# Patient Record
Sex: Female | Born: 1945
Health system: Southern US, Community
[De-identification: ages and names within clinical notes are randomized; demographics above are authoritative.]

## PROBLEM LIST (undated history)

## (undated) DIAGNOSIS — N200 Calculus of kidney: Secondary | ICD-10-CM

## (undated) DIAGNOSIS — I10 Essential (primary) hypertension: Secondary | ICD-10-CM

## (undated) DIAGNOSIS — C801 Malignant (primary) neoplasm, unspecified: Secondary | ICD-10-CM

## (undated) DIAGNOSIS — I82409 Acute embolism and thrombosis of unspecified deep veins of unspecified lower extremity: Secondary | ICD-10-CM

## (undated) DIAGNOSIS — D682 Hereditary deficiency of other clotting factors: Secondary | ICD-10-CM

## (undated) DIAGNOSIS — J449 Chronic obstructive pulmonary disease, unspecified: Secondary | ICD-10-CM

## (undated) DIAGNOSIS — K222 Esophageal obstruction: Secondary | ICD-10-CM

## (undated) DIAGNOSIS — E785 Hyperlipidemia, unspecified: Secondary | ICD-10-CM

## (undated) DIAGNOSIS — A809 Acute poliomyelitis, unspecified: Secondary | ICD-10-CM

## (undated) HISTORY — DX: Acute embolism and thrombosis of unspecified deep veins of unspecified lower extremity: I82.409

## (undated) HISTORY — DX: Esophageal obstruction: K22.2

## (undated) HISTORY — DX: Hyperlipidemia, unspecified: E78.5

## (undated) HISTORY — PX: OTHER SURGICAL HISTORY: SHX169

## (undated) HISTORY — DX: Essential (primary) hypertension: I10

## (undated) HISTORY — DX: Chronic obstructive pulmonary disease, unspecified: J44.9

## (undated) HISTORY — DX: Acute poliomyelitis, unspecified: A80.9

## (undated) HISTORY — DX: Calculus of kidney: N20.0

## (undated) HISTORY — DX: Malignant (primary) neoplasm, unspecified: C80.1

## (undated) HISTORY — DX: Hereditary deficiency of other clotting factors: D68.2

---
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
## 2015-08-26 DIAGNOSIS — J449 Chronic obstructive pulmonary disease, unspecified: Secondary | ICD-10-CM | POA: Diagnosis not present

## 2015-08-26 DIAGNOSIS — E785 Hyperlipidemia, unspecified: Secondary | ICD-10-CM | POA: Diagnosis not present

## 2015-08-26 DIAGNOSIS — I10 Essential (primary) hypertension: Secondary | ICD-10-CM | POA: Diagnosis not present

## 2015-08-26 DIAGNOSIS — I2699 Other pulmonary embolism without acute cor pulmonale: Secondary | ICD-10-CM | POA: Diagnosis not present

## 2015-08-26 DIAGNOSIS — R5383 Other fatigue: Secondary | ICD-10-CM | POA: Diagnosis not present

## 2015-08-26 DIAGNOSIS — E039 Hypothyroidism, unspecified: Secondary | ICD-10-CM | POA: Diagnosis not present

## 2015-08-26 DIAGNOSIS — I82402 Acute embolism and thrombosis of unspecified deep veins of left lower extremity: Secondary | ICD-10-CM | POA: Diagnosis not present

## 2015-08-26 DIAGNOSIS — R5381 Other malaise: Secondary | ICD-10-CM | POA: Diagnosis not present

## 2015-08-26 DIAGNOSIS — M069 Rheumatoid arthritis, unspecified: Secondary | ICD-10-CM | POA: Diagnosis not present

## 2015-08-26 DIAGNOSIS — M109 Gout, unspecified: Secondary | ICD-10-CM | POA: Diagnosis not present

## 2015-09-23 DIAGNOSIS — I2699 Other pulmonary embolism without acute cor pulmonale: Secondary | ICD-10-CM | POA: Diagnosis not present

## 2015-09-23 DIAGNOSIS — I82402 Acute embolism and thrombosis of unspecified deep veins of left lower extremity: Secondary | ICD-10-CM | POA: Diagnosis not present

## 2015-09-23 DIAGNOSIS — I10 Essential (primary) hypertension: Secondary | ICD-10-CM | POA: Diagnosis not present

## 2015-09-23 DIAGNOSIS — E785 Hyperlipidemia, unspecified: Secondary | ICD-10-CM | POA: Diagnosis not present

## 2015-10-07 DIAGNOSIS — R5381 Other malaise: Secondary | ICD-10-CM | POA: Diagnosis not present

## 2015-10-07 DIAGNOSIS — Z205 Contact with and (suspected) exposure to viral hepatitis: Secondary | ICD-10-CM | POA: Diagnosis not present

## 2015-10-07 DIAGNOSIS — I82402 Acute embolism and thrombosis of unspecified deep veins of left lower extremity: Secondary | ICD-10-CM | POA: Diagnosis not present

## 2015-10-07 DIAGNOSIS — E785 Hyperlipidemia, unspecified: Secondary | ICD-10-CM | POA: Diagnosis not present

## 2015-10-07 DIAGNOSIS — R5383 Other fatigue: Secondary | ICD-10-CM | POA: Diagnosis not present

## 2015-10-07 DIAGNOSIS — I10 Essential (primary) hypertension: Secondary | ICD-10-CM | POA: Diagnosis not present

## 2015-10-07 DIAGNOSIS — K209 Esophagitis, unspecified: Secondary | ICD-10-CM | POA: Diagnosis not present

## 2015-10-07 DIAGNOSIS — I2699 Other pulmonary embolism without acute cor pulmonale: Secondary | ICD-10-CM | POA: Diagnosis not present

## 2015-10-20 DIAGNOSIS — J449 Chronic obstructive pulmonary disease, unspecified: Secondary | ICD-10-CM | POA: Diagnosis not present

## 2015-10-20 DIAGNOSIS — I10 Essential (primary) hypertension: Secondary | ICD-10-CM | POA: Diagnosis not present

## 2015-11-09 DIAGNOSIS — G4730 Sleep apnea, unspecified: Secondary | ICD-10-CM | POA: Diagnosis not present

## 2015-11-09 DIAGNOSIS — I10 Essential (primary) hypertension: Secondary | ICD-10-CM | POA: Diagnosis not present

## 2015-11-09 DIAGNOSIS — I82402 Acute embolism and thrombosis of unspecified deep veins of left lower extremity: Secondary | ICD-10-CM | POA: Diagnosis not present

## 2015-11-09 DIAGNOSIS — J449 Chronic obstructive pulmonary disease, unspecified: Secondary | ICD-10-CM | POA: Diagnosis not present

## 2016-09-08 DIAGNOSIS — Z8719 Personal history of other diseases of the digestive system: Secondary | ICD-10-CM | POA: Diagnosis not present

## 2016-09-08 DIAGNOSIS — E785 Hyperlipidemia, unspecified: Secondary | ICD-10-CM | POA: Diagnosis not present

## 2016-09-08 DIAGNOSIS — J449 Chronic obstructive pulmonary disease, unspecified: Secondary | ICD-10-CM | POA: Diagnosis not present

## 2016-09-08 DIAGNOSIS — D6851 Activated protein C resistance: Secondary | ICD-10-CM | POA: Diagnosis not present

## 2016-09-08 DIAGNOSIS — I10 Essential (primary) hypertension: Secondary | ICD-10-CM | POA: Diagnosis not present

## 2016-09-08 DIAGNOSIS — M329 Systemic lupus erythematosus, unspecified: Secondary | ICD-10-CM | POA: Diagnosis not present

## 2016-10-24 DIAGNOSIS — D6851 Activated protein C resistance: Secondary | ICD-10-CM | POA: Diagnosis not present

## 2016-10-24 DIAGNOSIS — I10 Essential (primary) hypertension: Secondary | ICD-10-CM | POA: Diagnosis not present

## 2016-10-24 DIAGNOSIS — E785 Hyperlipidemia, unspecified: Secondary | ICD-10-CM | POA: Diagnosis not present

## 2016-11-17 DIAGNOSIS — R1314 Dysphagia, pharyngoesophageal phase: Secondary | ICD-10-CM | POA: Diagnosis not present

## 2016-11-17 DIAGNOSIS — J343 Hypertrophy of nasal turbinates: Secondary | ICD-10-CM | POA: Diagnosis not present

## 2016-11-17 DIAGNOSIS — T161XXA Foreign body in right ear, initial encounter: Secondary | ICD-10-CM | POA: Diagnosis not present

## 2016-11-17 DIAGNOSIS — H6122 Impacted cerumen, left ear: Secondary | ICD-10-CM | POA: Diagnosis not present

## 2016-11-23 ENCOUNTER — Other Ambulatory Visit: Payer: Self-pay | Admitting: Otolaryngology

## 2016-11-23 DIAGNOSIS — K222 Esophageal obstruction: Secondary | ICD-10-CM

## 2016-12-07 ENCOUNTER — Ambulatory Visit
Admission: RE | Admit: 2016-12-07 | Discharge: 2016-12-07 | Disposition: A | Discharge status: Home / Self Care | Payer: Medicare Other | Source: Ambulatory Visit | Attending: Otolaryngology | Admitting: Otolaryngology

## 2016-12-07 DIAGNOSIS — R1310 Dysphagia, unspecified: Secondary | ICD-10-CM | POA: Diagnosis not present

## 2016-12-07 DIAGNOSIS — K222 Esophageal obstruction: Secondary | ICD-10-CM

## 2017-01-25 DIAGNOSIS — R609 Edema, unspecified: Secondary | ICD-10-CM | POA: Diagnosis not present

## 2017-02-08 DIAGNOSIS — R609 Edema, unspecified: Secondary | ICD-10-CM | POA: Diagnosis not present

## 2017-02-10 ENCOUNTER — Other Ambulatory Visit: Payer: Self-pay

## 2017-02-10 DIAGNOSIS — R600 Localized edema: Secondary | ICD-10-CM

## 2017-02-15 ENCOUNTER — Encounter: Payer: Self-pay | Admitting: Vascular Surgery

## 2017-04-11 ENCOUNTER — Ambulatory Visit (INDEPENDENT_AMBULATORY_CARE_PROVIDER_SITE_OTHER): Payer: Medicare Other | Admitting: Vascular Surgery

## 2017-04-11 ENCOUNTER — Ambulatory Visit (HOSPITAL_COMMUNITY)
Admission: RE | Admit: 2017-04-11 | Discharge: 2017-04-11 | Disposition: A | Discharge status: Home / Self Care | Payer: Medicare Other | Source: Ambulatory Visit | Attending: Vascular Surgery | Admitting: Vascular Surgery

## 2017-04-11 ENCOUNTER — Encounter: Payer: Self-pay | Admitting: Vascular Surgery

## 2017-04-11 VITALS — BP 125/79 | HR 85 | Resp 16 | Ht 62.0 in | Wt 177.3 lb

## 2017-04-11 DIAGNOSIS — I83893 Varicose veins of bilateral lower extremities with other complications: Secondary | ICD-10-CM | POA: Diagnosis not present

## 2017-04-11 DIAGNOSIS — R600 Localized edema: Secondary | ICD-10-CM | POA: Diagnosis not present

## 2017-04-11 DIAGNOSIS — I87303 Chronic venous hypertension (idiopathic) without complications of bilateral lower extremity: Secondary | ICD-10-CM | POA: Diagnosis not present

## 2017-04-11 DIAGNOSIS — I868 Varicose veins of other specified sites: Secondary | ICD-10-CM

## 2017-04-11 NOTE — Progress Notes (Signed)
Vascular and Vein Specialist of [facility]  Patient name: Jessica Orr MRN: [id number] DOB: [birth year] Sex: female  REASON FOR CONSULT: Evaluation of bilateral lower extremity swelling  HPI: Jessica Orr is a 71 y.o. female, who is seen today for evaluation of bilateral lower from the swelling. She has a long history of left leg DVT dating back nearly 50 years. She reports an episode of left leg DVT after the birth of her son who is now 54 years old. She does her call treatment with Coumadin therapy for a number of years and reports that she may have had as many as 6 different episodes of left leg DVT. Currently she is on Plavix and is not on anticoagulation. She's had no recent history of DVT. She is positive for factor V Leiden deficiency. Also had history of polio as an infant. She does have marked swelling in her left leg compared to her right and reports this is worse in the summer. She did have some relief of this in the past with thigh-high graduated compression garments but has not worn these recently.  Past Medical History:  Diagnosis Date  . Cancer ([place])    uterine  . COPD (chronic obstructive pulmonary disease) ([place])   . DVT (deep venous thrombosis) ([place])   . Esophageal stricture   . Factor V deficiency ([place])   . Hyperlipidemia   . Hypertension   . Nephrolithiasis   . Polio 1950    Family History  Problem Relation Age of Onset  . Hodgkin's lymphoma Mother   . Alzheimer's disease Father   . Hypertension Father   . Cancer Maternal Grandmother   . Emphysema Maternal Grandfather   . Leukemia Paternal Grandmother     SOCIAL HISTORY: Social History   Social History  . Marital status: Widowed    Spouse name: N/A  . Number of children: N/A  . Years of education: N/A   Occupational History  . Not on file.   Social History Main Topics  . Smoking status: Former Smoker    Packs/day: 1.00    Quit date: 02/16/2016  . Smokeless tobacco: Never  Used  . Alcohol use No  . Drug use: No  . Sexual activity: Not on file   Other Topics Concern  . Not on file   Social History Narrative  . No narrative on file    Allergies  Allergen Reactions  . Ciprofloxacin Anaphylaxis    unknown    Current Outpatient Prescriptions  Medication Sig Dispense Refill  . acetaminophen (TYLENOL) 500 MG tablet Take 500 mg by mouth every 6 (six) hours as needed.    Marland Kitchen amLODipine (NORVASC) 2.5 MG tablet     . clopidogrel (PLAVIX) 75 MG tablet     . furosemide (LASIX) 40 MG tablet     . lisinopril (PRINIVIL,ZESTRIL) 5 MG tablet     . potassium chloride (K-DUR) 10 MEQ tablet Take 10 mEq by mouth daily.    . simvastatin (ZOCOR) 20 MG tablet      No current facility-administered medications for this visit.     REVIEW OF SYSTEMS:  [X]  denotes positive finding, [ ]  denotes negative finding Cardiac  Comments:  Chest pain or chest pressure:    Shortness of breath upon exertion:    Short of breath when lying flat: x   Irregular heart rhythm:        Vascular    Pain in calf, thigh, or hip brought on by ambulation:  Pain in feet at night that wakes you up from your sleep:     Blood clot in your veins: x   Leg swelling:  x       Pulmonary    Oxygen at home:    Productive cough:     Wheezing:         Neurologic    Sudden weakness in arms or legs:     Sudden numbness in arms or legs:     Sudden onset of difficulty speaking or slurred speech:    Temporary loss of vision in one eye:     Problems with dizziness:         Gastrointestinal    Blood in stool:     Vomited blood:         Genitourinary    Burning when urinating:     Blood in urine:        Psychiatric    Major depression:         Hematologic    Bleeding problems:    Problems with blood clotting too easily:        Skin    Rashes or ulcers:        Constitutional    Fever or chills:      PHYSICAL EXAM: Vitals:   04/11/17 1514  BP: 125/79  Pulse: 85  Resp: 16  SpO2:  98%  Weight: 177 lb 4.8 oz (80.4 kg)  Height: 5\' 2"  (1.575 m)    GENERAL: The patient is a well-nourished female, in no acute distress. The vital signs are documented above. CARDIOVASCULAR: Palpable radial and palpable dorsalis pedis pulses bilaterally. Marked swelling in the left leg with pitting edema. This extends from her calf down to her ankle but does not involve her foot. On the right she has mild swelling. She does have some skin thickening but no significant hemosiderin deposits on either lower extremity and no ulcerations PULMONARY: There is good air exchange  ABDOMEN: Soft and non-tender  MUSCULOSKELETAL: There are no major deformities or cyanosis. NEUROLOGIC: No focal weakness or paresthesias are detected. SKIN: There are no ulcers or rashes noted. PSYCHIATRIC: The patient has a normal affect.  DATA:  Noninvasive studies are office today revealed no evidence of DVT bilaterally. She does have reflux on the left leg and both her deep and superficial system. She has marked enlargement in her great saphenous vein ranging from 6-9 mm throughout its course. On the right leg she has mild reflux in the deep system in the common femoral vein only and does have reflux in her great saphenous vein but no enlargement.  MEDICAL ISSUES: Had long discussion with patient regarding this. She has had good benefit with her graduated compression garments in the past. We have measured and fitted her with these today. I do feel that she would be a candidate for left leg great saphenous vein ablation. Explained this would have no effect on her deep venous reflux but would correct the superficial component of her reflux. We will discuss this option further depending upon her response to elevation and compression. We will see her again in 3 months for continued discussion   Rosetta Posner, MD [facility] Vascular and Vein Specialists of [facility] Tel [phone] Pager [phone]

## 2017-04-25 DIAGNOSIS — J449 Chronic obstructive pulmonary disease, unspecified: Secondary | ICD-10-CM | POA: Diagnosis not present

## 2017-04-25 DIAGNOSIS — I10 Essential (primary) hypertension: Secondary | ICD-10-CM | POA: Diagnosis not present

## 2017-04-25 DIAGNOSIS — Z23 Encounter for immunization: Secondary | ICD-10-CM | POA: Diagnosis not present

## 2017-04-25 DIAGNOSIS — R600 Localized edema: Secondary | ICD-10-CM | POA: Diagnosis not present

## 2017-04-25 DIAGNOSIS — E785 Hyperlipidemia, unspecified: Secondary | ICD-10-CM | POA: Diagnosis not present

## 2017-04-25 DIAGNOSIS — Z86711 Personal history of pulmonary embolism: Secondary | ICD-10-CM | POA: Diagnosis not present

## 2017-07-18 ENCOUNTER — Ambulatory Visit (INDEPENDENT_AMBULATORY_CARE_PROVIDER_SITE_OTHER): Payer: Medicare Other | Admitting: Vascular Surgery

## 2017-07-18 ENCOUNTER — Encounter: Payer: Self-pay | Admitting: Vascular Surgery

## 2017-07-18 VITALS — BP 106/62 | HR 59 | Temp 97.6°F | Resp 18 | Ht 62.0 in | Wt 177.0 lb

## 2017-07-18 DIAGNOSIS — I87303 Chronic venous hypertension (idiopathic) without complications of bilateral lower extremity: Secondary | ICD-10-CM

## 2017-07-18 NOTE — Progress Notes (Signed)
Vascular and Vein Specialist of [facility]  Patient name: Jessica Orr MRN: [id number] DOB: 1946/06/29 Sex: female  REASON FOR VISIT: Follow-up venous hypertension  HPI: Jessica Orr is a 72 y.o. female here today for follow-up.  She continues to be extremely compliant with her thigh-high graduated compression garments.  These do control her discomfort and swelling.  She does have more swelling on her left leg than her right.  Has long history of multiple prior left leg DVTs.  Her noninvasive studies 3 months ago revealed reflux in her deep and superficial system bilaterally.  She did have enlargement of her left great saphenous vein.  Past Medical History:  Diagnosis Date  . Cancer ([place])    uterine  . COPD (chronic obstructive pulmonary disease) ([place])   . DVT (deep venous thrombosis) ([place])   . Esophageal stricture   . Factor V deficiency ([place])   . Hyperlipidemia   . Hypertension   . Nephrolithiasis   . Polio 1950    Family History  Problem Relation Age of Onset  . Hodgkin's lymphoma Mother   . Alzheimer's disease Father   . Hypertension Father   . Cancer Maternal Grandmother   . Emphysema Maternal Grandfather   . Leukemia Paternal Grandmother     SOCIAL HISTORY: Social History   Tobacco Use  . Smoking status: Former Smoker    Packs/day: 1.00    Last attempt to quit: 02/16/2016    Years since quitting: 1.4  . Smokeless tobacco: Never Used  Substance Use Topics  . Alcohol use: No    Allergies  Allergen Reactions  . Ciprofloxacin Anaphylaxis    unknown    Current Outpatient Medications  Medication Sig Dispense Refill  . amLODipine (NORVASC) 2.5 MG tablet     . atorvastatin (LIPITOR) 10 MG tablet Take 10 mg by mouth daily. Take one at bedtime    . clopidogrel (PLAVIX) 75 MG tablet     . lisinopril (PRINIVIL,ZESTRIL) 5 MG tablet     . acetaminophen (TYLENOL) 500 MG tablet Take 500 mg by mouth every 6 (six) hours as needed.    .  furosemide (LASIX) 40 MG tablet     . potassium chloride (K-DUR) 10 MEQ tablet Take 10 mEq by mouth daily.    . simvastatin (ZOCOR) 20 MG tablet      No current facility-administered medications for this visit.     REVIEW OF SYSTEMS:  [X]  denotes positive finding, [ ]  denotes negative finding Cardiac  Comments:  Chest pain or chest pressure:    Shortness of breath upon exertion:    Short of breath when lying flat:    Irregular heart rhythm:        Vascular    Pain in calf, thigh, or hip brought on by ambulation:    Pain in feet at night that wakes you up from your sleep:     Blood clot in your veins:    Leg swelling:  x         PHYSICAL EXAM: Vitals:   07/18/17 0912  BP: 106/62  Pulse: (!) 59  Resp: 18  Temp: 97.6 F (36.4 C)  SpO2: 98%  Weight: 177 lb (80.3 kg)  Height: 5\' 2"  (1.575 m)    GENERAL: The patient is a well-nourished female, in no acute distress. The vital signs are documented above. CARDIOVASCULAR: Swelling in her both lower extremities PULMONARY: There is good air exchange  MUSCULOSKELETAL: There are no major deformities or cyanosis. NEUROLOGIC: No  focal weakness or paresthesias are detected. SKIN: There are no ulcers or rashes noted.  Thickening and some hemosiderin deposits in her distal calves and ankles bilaterally with no open ulceration PSYCHIATRIC: The patient has a normal affect.  DATA:  Reviewed her prior duplex from 3 months ago as discussed above  MEDICAL ISSUES: Had long discussion with patient regarding options.  She is extremely compliant with her compression is having good results with this.  I explained that we could improve the superficial component of her reflux on the left with laser ablation.  I explained that she would still have severe deep venous reflux and so therefore would have marginal improvement.  I am comfortable with continued elevation and observation and the patient prefers this as well.  We have given her directions to a  local compression hose manufacture for a new hose.  She will see [place] again on an as-needed basis    Rosetta Posner, MD [facility] Vascular and Vein Specialists of [facility] Tel [phone] Pager [phone]

## 2017-10-16 DIAGNOSIS — E785 Hyperlipidemia, unspecified: Secondary | ICD-10-CM | POA: Diagnosis not present

## 2017-10-16 DIAGNOSIS — I10 Essential (primary) hypertension: Secondary | ICD-10-CM | POA: Diagnosis not present

## 2017-10-16 DIAGNOSIS — J449 Chronic obstructive pulmonary disease, unspecified: Secondary | ICD-10-CM | POA: Diagnosis not present

## 2017-10-16 DIAGNOSIS — D6851 Activated protein C resistance: Secondary | ICD-10-CM | POA: Diagnosis not present

## 2018-04-26 DIAGNOSIS — E785 Hyperlipidemia, unspecified: Secondary | ICD-10-CM | POA: Diagnosis not present

## 2018-04-26 DIAGNOSIS — Z23 Encounter for immunization: Secondary | ICD-10-CM | POA: Diagnosis not present

## 2018-04-26 DIAGNOSIS — I10 Essential (primary) hypertension: Secondary | ICD-10-CM | POA: Diagnosis not present

## 2018-04-26 DIAGNOSIS — D6851 Activated protein C resistance: Secondary | ICD-10-CM | POA: Diagnosis not present

## 2018-04-26 DIAGNOSIS — R600 Localized edema: Secondary | ICD-10-CM | POA: Diagnosis not present

## 2018-04-26 DIAGNOSIS — J449 Chronic obstructive pulmonary disease, unspecified: Secondary | ICD-10-CM | POA: Diagnosis not present

## 2018-11-30 DIAGNOSIS — R600 Localized edema: Secondary | ICD-10-CM | POA: Diagnosis not present

## 2018-11-30 DIAGNOSIS — M79642 Pain in left hand: Secondary | ICD-10-CM | POA: Diagnosis not present

## 2018-12-04 ENCOUNTER — Other Ambulatory Visit: Payer: Self-pay | Admitting: Physician Assistant

## 2018-12-04 ENCOUNTER — Other Ambulatory Visit: Payer: Self-pay

## 2018-12-04 ENCOUNTER — Ambulatory Visit
Admission: RE | Admit: 2018-12-04 | Discharge: 2018-12-04 | Disposition: A | Discharge status: Home / Self Care | Payer: Medicare Other | Source: Ambulatory Visit | Attending: Physician Assistant | Admitting: Physician Assistant

## 2018-12-04 DIAGNOSIS — M79642 Pain in left hand: Secondary | ICD-10-CM | POA: Diagnosis not present

## 2019-01-02 DIAGNOSIS — R21 Rash and other nonspecific skin eruption: Secondary | ICD-10-CM | POA: Diagnosis not present

## 2019-01-30 DIAGNOSIS — J449 Chronic obstructive pulmonary disease, unspecified: Secondary | ICD-10-CM | POA: Diagnosis not present

## 2019-01-30 DIAGNOSIS — E785 Hyperlipidemia, unspecified: Secondary | ICD-10-CM | POA: Diagnosis not present

## 2019-01-30 DIAGNOSIS — M79642 Pain in left hand: Secondary | ICD-10-CM | POA: Diagnosis not present

## 2019-01-30 DIAGNOSIS — I10 Essential (primary) hypertension: Secondary | ICD-10-CM | POA: Diagnosis not present

## 2019-01-30 DIAGNOSIS — M79641 Pain in right hand: Secondary | ICD-10-CM | POA: Diagnosis not present

## 2019-01-30 DIAGNOSIS — D6851 Activated protein C resistance: Secondary | ICD-10-CM | POA: Diagnosis not present

## 2019-01-30 DIAGNOSIS — Z86711 Personal history of pulmonary embolism: Secondary | ICD-10-CM | POA: Diagnosis not present

## 2019-01-30 DIAGNOSIS — Z23 Encounter for immunization: Secondary | ICD-10-CM | POA: Diagnosis not present

## 2019-01-30 DIAGNOSIS — Z0000 Encounter for general adult medical examination without abnormal findings: Secondary | ICD-10-CM | POA: Diagnosis not present

## 2019-03-14 DIAGNOSIS — E785 Hyperlipidemia, unspecified: Secondary | ICD-10-CM | POA: Diagnosis not present

## 2019-03-20 DIAGNOSIS — M19071 Primary osteoarthritis, right ankle and foot: Secondary | ICD-10-CM | POA: Diagnosis not present

## 2019-03-20 DIAGNOSIS — R5383 Other fatigue: Secondary | ICD-10-CM | POA: Diagnosis not present

## 2019-03-20 DIAGNOSIS — D8989 Other specified disorders involving the immune mechanism, not elsewhere classified: Secondary | ICD-10-CM | POA: Diagnosis not present

## 2019-03-20 DIAGNOSIS — M79643 Pain in unspecified hand: Secondary | ICD-10-CM | POA: Diagnosis not present

## 2019-03-20 DIAGNOSIS — N390 Urinary tract infection, site not specified: Secondary | ICD-10-CM | POA: Diagnosis not present

## 2019-03-20 DIAGNOSIS — I878 Other specified disorders of veins: Secondary | ICD-10-CM | POA: Diagnosis not present

## 2019-03-20 DIAGNOSIS — Z8739 Personal history of other diseases of the musculoskeletal system and connective tissue: Secondary | ICD-10-CM | POA: Diagnosis not present

## 2019-03-20 DIAGNOSIS — D6851 Activated protein C resistance: Secondary | ICD-10-CM | POA: Diagnosis not present

## 2019-03-20 DIAGNOSIS — M79671 Pain in right foot: Secondary | ICD-10-CM | POA: Diagnosis not present

## 2019-03-20 DIAGNOSIS — M19041 Primary osteoarthritis, right hand: Secondary | ICD-10-CM | POA: Diagnosis not present

## 2019-03-20 DIAGNOSIS — M549 Dorsalgia, unspecified: Secondary | ICD-10-CM | POA: Diagnosis not present

## 2019-03-20 DIAGNOSIS — M79641 Pain in right hand: Secondary | ICD-10-CM | POA: Diagnosis not present

## 2019-03-20 DIAGNOSIS — M79642 Pain in left hand: Secondary | ICD-10-CM | POA: Diagnosis not present

## 2019-03-20 DIAGNOSIS — M1990 Unspecified osteoarthritis, unspecified site: Secondary | ICD-10-CM | POA: Diagnosis not present

## 2019-03-20 DIAGNOSIS — M7989 Other specified soft tissue disorders: Secondary | ICD-10-CM | POA: Diagnosis not present

## 2019-03-20 DIAGNOSIS — M79672 Pain in left foot: Secondary | ICD-10-CM | POA: Diagnosis not present

## 2019-03-20 DIAGNOSIS — M545 Low back pain: Secondary | ICD-10-CM | POA: Diagnosis not present

## 2019-03-20 DIAGNOSIS — M19072 Primary osteoarthritis, left ankle and foot: Secondary | ICD-10-CM | POA: Diagnosis not present

## 2019-03-20 DIAGNOSIS — M546 Pain in thoracic spine: Secondary | ICD-10-CM | POA: Diagnosis not present

## 2019-03-20 DIAGNOSIS — E559 Vitamin D deficiency, unspecified: Secondary | ICD-10-CM | POA: Diagnosis not present

## 2019-03-20 DIAGNOSIS — M19042 Primary osteoarthritis, left hand: Secondary | ICD-10-CM | POA: Diagnosis not present

## 2019-03-20 DIAGNOSIS — Z86718 Personal history of other venous thrombosis and embolism: Secondary | ICD-10-CM | POA: Diagnosis not present

## 2019-04-11 DIAGNOSIS — E559 Vitamin D deficiency, unspecified: Secondary | ICD-10-CM | POA: Diagnosis not present

## 2019-04-11 DIAGNOSIS — M1990 Unspecified osteoarthritis, unspecified site: Secondary | ICD-10-CM | POA: Diagnosis not present

## 2019-04-11 DIAGNOSIS — M79643 Pain in unspecified hand: Secondary | ICD-10-CM | POA: Diagnosis not present

## 2019-04-11 DIAGNOSIS — Z8739 Personal history of other diseases of the musculoskeletal system and connective tissue: Secondary | ICD-10-CM | POA: Diagnosis not present

## 2019-04-11 DIAGNOSIS — R5383 Other fatigue: Secondary | ICD-10-CM | POA: Diagnosis not present

## 2019-04-11 DIAGNOSIS — M359 Systemic involvement of connective tissue, unspecified: Secondary | ICD-10-CM | POA: Diagnosis not present

## 2019-04-11 DIAGNOSIS — I878 Other specified disorders of veins: Secondary | ICD-10-CM | POA: Diagnosis not present

## 2019-04-11 DIAGNOSIS — M549 Dorsalgia, unspecified: Secondary | ICD-10-CM | POA: Diagnosis not present

## 2019-04-11 DIAGNOSIS — M7989 Other specified soft tissue disorders: Secondary | ICD-10-CM | POA: Diagnosis not present

## 2019-04-11 DIAGNOSIS — Z23 Encounter for immunization: Secondary | ICD-10-CM | POA: Diagnosis not present

## 2019-07-15 DIAGNOSIS — E559 Vitamin D deficiency, unspecified: Secondary | ICD-10-CM | POA: Diagnosis not present

## 2019-07-15 DIAGNOSIS — M549 Dorsalgia, unspecified: Secondary | ICD-10-CM | POA: Diagnosis not present

## 2019-07-15 DIAGNOSIS — M359 Systemic involvement of connective tissue, unspecified: Secondary | ICD-10-CM | POA: Diagnosis not present

## 2019-07-15 DIAGNOSIS — I878 Other specified disorders of veins: Secondary | ICD-10-CM | POA: Diagnosis not present

## 2019-07-15 DIAGNOSIS — M7989 Other specified soft tissue disorders: Secondary | ICD-10-CM | POA: Diagnosis not present

## 2019-07-15 DIAGNOSIS — R5383 Other fatigue: Secondary | ICD-10-CM | POA: Diagnosis not present

## 2019-07-15 DIAGNOSIS — M1990 Unspecified osteoarthritis, unspecified site: Secondary | ICD-10-CM | POA: Diagnosis not present

## 2019-07-15 DIAGNOSIS — Z8739 Personal history of other diseases of the musculoskeletal system and connective tissue: Secondary | ICD-10-CM | POA: Diagnosis not present

## 2019-07-15 DIAGNOSIS — M79643 Pain in unspecified hand: Secondary | ICD-10-CM | POA: Diagnosis not present

## 2019-07-24 ENCOUNTER — Other Ambulatory Visit: Payer: Self-pay | Admitting: Physician Assistant

## 2019-07-24 DIAGNOSIS — Z13820 Encounter for screening for osteoporosis: Secondary | ICD-10-CM

## 2019-07-24 DIAGNOSIS — Z1231 Encounter for screening mammogram for malignant neoplasm of breast: Secondary | ICD-10-CM

## 2019-08-02 DIAGNOSIS — D6851 Activated protein C resistance: Secondary | ICD-10-CM | POA: Diagnosis not present

## 2019-08-02 DIAGNOSIS — J449 Chronic obstructive pulmonary disease, unspecified: Secondary | ICD-10-CM | POA: Diagnosis not present

## 2019-08-02 DIAGNOSIS — E785 Hyperlipidemia, unspecified: Secondary | ICD-10-CM | POA: Diagnosis not present

## 2019-08-02 DIAGNOSIS — I82403 Acute embolism and thrombosis of unspecified deep veins of lower extremity, bilateral: Secondary | ICD-10-CM | POA: Diagnosis not present

## 2019-08-02 DIAGNOSIS — I10 Essential (primary) hypertension: Secondary | ICD-10-CM | POA: Diagnosis not present

## 2019-08-02 DIAGNOSIS — M329 Systemic lupus erythematosus, unspecified: Secondary | ICD-10-CM | POA: Diagnosis not present

## 2019-08-02 DIAGNOSIS — Z86711 Personal history of pulmonary embolism: Secondary | ICD-10-CM | POA: Diagnosis not present

## 2019-08-06 ENCOUNTER — Telehealth: Payer: Self-pay | Admitting: Hematology

## 2019-08-06 NOTE — Telephone Encounter (Signed)
Received a new hem referral from Dr. Mannie Stabile at [place] for hx of pe. Pt has been cld and scheduled to see Dr. Irene Limbo on 2/10 at 10am. She's been made aware to arrive 15 minutes early.

## 2019-08-21 ENCOUNTER — Inpatient Hospital Stay: Payer: Medicare Other

## 2019-08-21 ENCOUNTER — Inpatient Hospital Stay: Payer: Medicare Other | Attending: Hematology | Admitting: Hematology

## 2019-08-21 ENCOUNTER — Other Ambulatory Visit: Payer: Self-pay

## 2019-08-21 VITALS — BP 134/61 | HR 69 | Temp 98.5°F | Resp 18 | Ht 62.0 in | Wt 174.9 lb

## 2019-08-21 DIAGNOSIS — E785 Hyperlipidemia, unspecified: Secondary | ICD-10-CM | POA: Diagnosis not present

## 2019-08-21 DIAGNOSIS — I82409 Acute embolism and thrombosis of unspecified deep veins of unspecified lower extremity: Secondary | ICD-10-CM

## 2019-08-21 DIAGNOSIS — D6859 Other primary thrombophilia: Secondary | ICD-10-CM

## 2019-08-21 DIAGNOSIS — D682 Hereditary deficiency of other clotting factors: Secondary | ICD-10-CM | POA: Diagnosis not present

## 2019-08-21 DIAGNOSIS — Z79899 Other long term (current) drug therapy: Secondary | ICD-10-CM | POA: Insufficient documentation

## 2019-08-21 DIAGNOSIS — J449 Chronic obstructive pulmonary disease, unspecified: Secondary | ICD-10-CM | POA: Diagnosis not present

## 2019-08-21 DIAGNOSIS — Z8544 Personal history of malignant neoplasm of other female genital organs: Secondary | ICD-10-CM | POA: Insufficient documentation

## 2019-08-21 DIAGNOSIS — R42 Dizziness and giddiness: Secondary | ICD-10-CM | POA: Insufficient documentation

## 2019-08-21 DIAGNOSIS — Z8249 Family history of ischemic heart disease and other diseases of the circulatory system: Secondary | ICD-10-CM | POA: Diagnosis not present

## 2019-08-21 DIAGNOSIS — M7989 Other specified soft tissue disorders: Secondary | ICD-10-CM | POA: Insufficient documentation

## 2019-08-21 DIAGNOSIS — D6851 Activated protein C resistance: Secondary | ICD-10-CM

## 2019-08-21 DIAGNOSIS — Z7902 Long term (current) use of antithrombotics/antiplatelets: Secondary | ICD-10-CM | POA: Diagnosis not present

## 2019-08-21 DIAGNOSIS — I7300 Raynaud's syndrome without gangrene: Secondary | ICD-10-CM | POA: Diagnosis not present

## 2019-08-21 DIAGNOSIS — I10 Essential (primary) hypertension: Secondary | ICD-10-CM | POA: Diagnosis not present

## 2019-08-21 DIAGNOSIS — Z86718 Personal history of other venous thrombosis and embolism: Secondary | ICD-10-CM | POA: Insufficient documentation

## 2019-08-21 DIAGNOSIS — M329 Systemic lupus erythematosus, unspecified: Secondary | ICD-10-CM | POA: Diagnosis not present

## 2019-08-21 DIAGNOSIS — Z87891 Personal history of nicotine dependence: Secondary | ICD-10-CM | POA: Diagnosis not present

## 2019-08-21 DIAGNOSIS — Z8049 Family history of malignant neoplasm of other genital organs: Secondary | ICD-10-CM | POA: Diagnosis not present

## 2019-08-21 DIAGNOSIS — Z86711 Personal history of pulmonary embolism: Secondary | ICD-10-CM | POA: Diagnosis not present

## 2019-08-21 LAB — CMP (CANCER CENTER ONLY)
ALT: 20 U/L (ref 0–44)
AST: 21 U/L (ref 15–41)
Albumin: 3.7 g/dL (ref 3.5–5.0)
Alkaline Phosphatase: 129 U/L — ABNORMAL HIGH (ref 38–126)
Anion gap: 10 (ref 5–15)
BUN: 14 mg/dL (ref 8–23)
CO2: 28 mmol/L (ref 22–32)
Calcium: 9.1 mg/dL (ref 8.9–10.3)
Chloride: 105 mmol/L (ref 98–111)
Creatinine: 0.74 mg/dL (ref 0.44–1.00)
GFR, Est AFR Am: >60 mL/min (ref >60)
GFR, Estimated: >60 mL/min (ref >60)
Glucose, Bld: 69 mg/dL — ABNORMAL LOW (ref 70–99)
Potassium: 4.3 mmol/L (ref 3.5–5.1)
Sodium: 143 mmol/L (ref 135–145)
Total Bilirubin: <0.2 mg/dL — ABNORMAL LOW (ref 0.3–1.2)
Total Protein: 7.1 g/dL (ref 6.5–8.1)

## 2019-08-21 LAB — CBC WITH DIFFERENTIAL/PLATELET
Abs Immature Granulocytes: 0.01 10*3/uL (ref 0.00–0.07)
Basophils Absolute: 0 10*3/uL (ref 0.0–0.1)
Basophils Relative: 1 %
Eosinophils Absolute: 0.2 10*3/uL (ref 0.0–0.5)
Eosinophils Relative: 3 %
HCT: 41.1 % (ref 36.0–46.0)
Hemoglobin: 13 g/dL (ref 12.0–15.0)
Immature Granulocytes: 0 %
Lymphocytes Relative: 20 %
Lymphs Abs: 1.3 10*3/uL (ref 0.7–4.0)
MCH: 28.4 pg (ref 26.0–34.0)
MCHC: 31.6 g/dL (ref 30.0–36.0)
MCV: 89.9 fL (ref 80.0–100.0)
Monocytes Absolute: 0.7 10*3/uL (ref 0.1–1.0)
Monocytes Relative: 11 %
Neutro Abs: 4.1 10*3/uL (ref 1.7–7.7)
Neutrophils Relative %: 65 %
Platelets: 229 10*3/uL (ref 150–400)
RBC: 4.57 MIL/uL (ref 3.87–5.11)
RDW: 14.4 % (ref 11.5–15.5)
WBC: 6.3 10*3/uL (ref 4.0–10.5)
nRBC: 0 % (ref 0.0–0.2)

## 2019-08-21 LAB — ANTITHROMBIN III: AntiThromb III Func: 90 % (ref 75–120)

## 2019-08-21 NOTE — Patient Instructions (Signed)
Thank you for choosing [facility] to provide your oncology and hematology care.   Should you have questions after your visit to the [facility] ([facility]), please contact this office at [phone] between 8:30 AM and 4:30 PM.  Voice mails left after 4:00 PM may not be returned until the following business day.  Calls received after 4:30 PM will be answered by an off-site Nurse Triage Line.    Prescription Refills:  Please have your pharmacy contact us directly for most prescription requests.  Contact the office directly for refills of narcotics (pain medications). Allow 48-72 hours for refills.  Appointments: Please contact the [facility] scheduling department [phone] for questions regarding [facility] appointment scheduling.  Contact the schedulers with any scheduling changes so that your appointment can be rescheduled in a timely manner.   Central Scheduling for [place] [phone] - Call to schedule procedures such as PET scans, CT scans, MRI, Ultrasound, etc.  To afford each patient quality time with our providers, please arrive 30 minutes before your scheduled appointment time.  If you arrive late for your appointment, you may be asked to reschedule.  We strive to give you quality time with our providers, and arriving late affects you and other patients whose appointments are after yours. If you are a no show for multiple scheduled visits, you may be dismissed from the clinic at the providers discretion.     Resources: [facility] Social Workers [phone] for additional information on assistance programs or assistance connecting with community support programs   [place] DSS  [phone]: Information regarding food stamps, Medicaid, and utility assistance [place] [phone]   [place] [employer]'s shared-ride transportation service for eligible riders who have a disability that prevents them from riding the fixed route bus.   Medicare Rights Center  800-333-4114 Helps people with Medicare understand their rights and benefits, navigate the Medicare system, and secure the quality healthcare they deserve [employer] 800-227-2345 Assists patients locate various types of support and financial assistance Cancer Care: 1-800-813-HOPE (4673) Provides financial assistance, online support groups, medication/co-pay assistance.   Transportation Assistance for appointments at [facility]: Transportation Coordinator [phone]  Again, thank you for choosing [facility] for your care.       

## 2019-08-21 NOTE — Progress Notes (Addendum)
HEMATOLOGY/ONCOLOGY CONSULTATION NOTE  Date of Service: 08/21/2019  Patient Care Team: [facility] as PCP - General (Family Medicine)  CHIEF COMPLAINTS/PURPOSE OF CONSULTATION:  Hx of PE/DVT, Factor V Leiden heterozygous mutation  HISTORY OF PRESENTING ILLNESS:   Jessica Orr is a wonderful 74 y.o. female who has been referred to [place] by Dr Mannie Stabile for evaluation and management of Hx of PE/DVT, Factor V Leiden heterozygous mutation. The pt reports that she is doing well overall.   The pt reports that she had her first blood clot in her twenties. She had a DVT in her left leg and the clots passed through her lungs. There was no cause for the blood clot given. Pt was treated with Coumadin and Heparin in the hospital. Pt was placed on Asprin upon discharge. Her left leg continue to be incrediby swollen even after discharge. Pt had two other blood clots around 15-20 years ago. They also originated in the left leg. She has never been on birth control. Her subsequent blood clots were treated with Coumadin and Lovenox. Pt reports that she was having to get blood drawn daily for lab work during this period. She had a significant fall and her physician at the time switched her to Plavix. Her last fall was in August.   She has a significant history of kidney stones and has been told that she had Raynaud's syndrome. Pt had no blood clots with her pregnancy, but did have Uterine Cancer during her pregnancy. Her maternal grandmother also had Uterine Cancer. There was a chance that her mother had breast cancer but she is unsure. She does remember that her mother had to remove the milk out of her breast ducts. Pt has never had any genetic testing done regarding her Uterine Cancer. Pt had polio in the 1950's but has no residual leg weakness.   Her COPD and Lupus have been stable. She is currently talking Hydroxychloroquine for her Lupus. Pt has previously had several a facial rashes.  She has recently been feeling dizzy, especially at night. Pt has not been monitoring her BP at home regularly. When she gets up at night, she does so slowly and notices the lightheadedness when she moves her head straight back.     On review of systems, pt reports dizziness, left leg swelling and denies abdominal pain, constipation, diarrhea, unexpected weight loss, bleeding concerns and any other symptoms.   On PMHx the pt reports Uterine Cancer, COPD, DVT, Factor V Heterozygous mutation, HLD, HTN, Polo, Nephrolithiasis On Family Hx the pt reports maternal grandmother with Uterine Cancer  MEDICAL HISTORY:  Past Medical History:  Diagnosis Date  . Cancer ([place])    uterine  . COPD (chronic obstructive pulmonary disease) ([place])   . DVT (deep venous thrombosis) ([place])   . Esophageal stricture   . Factor V deficiency ([place])   . Hyperlipidemia   . Hypertension   . Nephrolithiasis   . Polio 1950    SURGICAL HISTORY: Past Surgical History:  Procedure Laterality Date  . nephrolithiasis      SOCIAL HISTORY: Social History   Socioeconomic History  . Marital status: Widowed    Spouse name: Not on file  . Number of children: Not on file  . Years of education: Not on file  . Highest education level: Not on file  Occupational History  . Not on file  Tobacco Use  . Smoking status: Former Smoker    Packs/day: 1.00    Quit  date: 02/16/2016    Years since quitting: 3.5  . Smokeless tobacco: Never Used  Substance and Sexual Activity  . Alcohol use: No  . Drug use: No  . Sexual activity: Not on file  Other Topics Concern  . Not on file  Social History Narrative  . Not on file   Social Determinants of Health   Financial Resource Strain:   . Difficulty of Paying Living Expenses: Not on file  Food Insecurity:   . Worried About Charity fundraiser in the Last Year: Not on file  . Ran Out of Food in the Last Year: Not on file  Transportation Needs:   . Lack of Transportation  (Medical): Not on file  . Lack of Transportation (Non-Medical): Not on file  Physical Activity:   . Days of Exercise per Week: Not on file  . Minutes of Exercise per Session: Not on file  Stress:   . Feeling of Stress : Not on file  Social Connections:   . Frequency of Communication with Friends and Family: Not on file  . Frequency of Social Gatherings with Friends and Family: Not on file  . Attends Religious Services: Not on file  . Active Member of Clubs or Organizations: Not on file  . Attends Archivist Meetings: Not on file  . Marital Status: Not on file  Intimate Partner Violence:   . Fear of Current or Ex-Partner: Not on file  . Emotionally Abused: Not on file  . Physically Abused: Not on file  . Sexually Abused: Not on file    FAMILY HISTORY: Family History  Problem Relation Age of Onset  . Hodgkin's lymphoma Mother   . Alzheimer's disease Father   . Hypertension Father   . Cancer Maternal Grandmother   . Emphysema Maternal Grandfather   . Leukemia Paternal Grandmother     ALLERGIES:  is allergic to ciprofloxacin.  MEDICATIONS:  Current Outpatient Medications  Medication Sig Dispense Refill  . acetaminophen (TYLENOL) 500 MG tablet Take 500 mg by mouth every 6 (six) hours as needed.    Marland Kitchen amLODipine (NORVASC) 2.5 MG tablet     . atorvastatin (LIPITOR) 10 MG tablet Take 10 mg by mouth daily. Take one at bedtime    . clopidogrel (PLAVIX) 75 MG tablet     . ergocalciferol (VITAMIN D2) 1.25 MG (50000 UT) capsule Take 50,000 Units by mouth once a week.    . hydroxychloroquine (PLAQUENIL) 200 MG tablet Take 200 mg by mouth 2 (two) times daily.    Marland Kitchen lisinopril (PRINIVIL,ZESTRIL) 5 MG tablet     . potassium chloride (K-DUR) 10 MEQ tablet Take 10 mEq by mouth daily.    . furosemide (LASIX) 40 MG tablet     . simvastatin (ZOCOR) 20 MG tablet      No current facility-administered medications for this visit.    REVIEW OF SYSTEMS:    10 Point review of Systems  was done is negative except as noted above.  PHYSICAL EXAMINATION: ECOG PERFORMANCE STATUS: 2 - Symptomatic, <50% confined to bed  . Vitals:   08/21/19 1007  BP: 134/61  Pulse: 69  Resp: 18  Temp: 98.5 F (36.9 C)  SpO2: 100%   Filed Weights   08/21/19 1007  Weight: 174 lb 14.4 oz (79.3 kg)   .Body mass index is 31.99 kg/m.  Exam was given in a chair   GENERAL:alert, in no acute distress and comfortable SKIN: no acute rashes, no significant lesions EYES: conjunctiva are  pink and non-injected, sclera anicteric OROPHARYNX: MMM, no exudates, no oropharyngeal erythema or ulceration NECK: supple, no JVD LYMPH:  no palpable lymphadenopathy in the cervical, axillary or inguinal regions LUNGS: clear to auscultation b/l with normal respiratory effort HEART: regular rate & rhythm ABDOMEN:  normoactive bowel sounds , non tender, not distended. Extremity: increased left leg swelling, 2-3+ with skin changes of venous stasis  PSYCH: alert & oriented x 3 with fluent speech NEURO: no focal motor/sensory deficits  LABORATORY DATA:  I have reviewed the data as listed  . CBC Latest Ref Rng & Units 08/21/2019  WBC 4.0 - 10.5 K/uL 6.3  Hemoglobin 12.0 - 15.0 g/dL 13.0  Hematocrit 36.0 - 46.0 % 41.1  Platelets 150 - 400 K/uL 229    . CMP Latest Ref Rng & Units 08/21/2019  Glucose 70 - 99 mg/dL 69(L)  BUN 8 - 23 mg/dL 14  Creatinine 0.44 - 1.00 mg/dL 0.74  Sodium 135 - 145 mmol/L 143  Potassium 3.5 - 5.1 mmol/L 4.3  Chloride 98 - 111 mmol/L 105  CO2 22 - 32 mmol/L 28  Calcium 8.9 - 10.3 mg/dL 9.1  Total Protein 6.5 - 8.1 g/dL 7.1  Total Bilirubin 0.3 - 1.2 mg/dL <0.2(L)  Alkaline Phos 38 - 126 U/L 129(H)  AST 15 - 41 U/L 21  ALT 0 - 44 U/L 20     RADIOGRAPHIC STUDIES: I have personally reviewed the radiological images as listed and agreed with the findings in the report. No results found.  ASSESSMENT & PLAN:   74 yo with   1) h/o Factor V leiden mutation 2) h/o  recurrent unprovoked VTE's PLAN: -Reviewed medical history  -Will get labs today to get a full risk profile  -Advised pt that she may have Benign Positional Vertigo, recommended she f/u with PCP if dizziness continues - her BP was good today -Advised pt that Plavix is not enough protection after several extensive blood clots with no provoking factor -Recommend pt begin full-dose anticoagulation with a newer preparation that needs less monitoring such as: Eliquis or Xarelto -Recommend pt continue using compression socks -Recommend pt continue to f/u with PCP for anticoagulation management -Will see back as needed  . Orders Placed This Encounter  Procedures  . CBC with Differential/Platelet    Standing Status:   Future    Number of Occurrences:   1    Standing Expiration Date:   09/24/2020  . CMP ([facility] only)    Standing Status:   Future    Number of Occurrences:   1    Standing Expiration Date:   08/20/2020  . Antithrombin III    Standing Status:   Future    Number of Occurrences:   1    Standing Expiration Date:   08/20/2020  . Protein C activity    Standing Status:   Future    Number of Occurrences:   1    Standing Expiration Date:   08/20/2020  . Protein C, total    Standing Status:   Future    Number of Occurrences:   1    Standing Expiration Date:   08/20/2020  . Protein S activity    Standing Status:   Future    Number of Occurrences:   1    Standing Expiration Date:   08/20/2020  . Protein S, total    Standing Status:   Future    Number of Occurrences:   1    Standing Expiration Date:  08/20/2020  . Lupus anticoagulant panel    Standing Status:   Future    Number of Occurrences:   1    Standing Expiration Date:   08/20/2020  . Beta-2-glycoprotein i abs, IgG/M/A    Standing Status:   Future    Number of Occurrences:   1    Standing Expiration Date:   08/20/2020  . Factor 5 leiden    Standing Status:   Future    Number of Occurrences:   1    Standing  Expiration Date:   08/20/2020  . Prothrombin gene mutation    Standing Status:   Future    Number of Occurrences:   1    Standing Expiration Date:   08/20/2020  . Cardiolipin antibodies, IgG, IgM, IgA    Standing Status:   Future    Number of Occurrences:   1    Standing Expiration Date:   08/20/2020    All of the patients questions were answered with apparent satisfaction. The patient knows to call the clinic with any problems, questions or concerns.  I spent 35 mins counseling the patient face to face. The total time spent in the appointment was 45 minutes and more than 50% was on counseling and direct patient cares.    Sullivan Lone MD [place] AAHIVMS [facility] Hematology/Oncology Physician [facility]  (Office):       [phone] (Work cell):  [phone] (Fax):           [phone]  08/21/2019 5:54 PM  I, Yevette Edwards, am acting as a scribe for Dr. Sullivan Lone.   .I have reviewed the above documentation for accuracy and completeness, and I agree with the above. Brunetta Genera MD    ADDENDUM  Component     Latest Ref Rng & Units 08/21/2019  WBC     4.0 - 10.5 K/uL 6.3  RBC     3.87 - 5.11 MIL/uL 4.57  Hemoglobin     12.0 - 15.0 g/dL 13.0  HCT     36.0 - 46.0 % 41.1  MCV     80.0 - 100.0 fL 89.9  MCH     26.0 - 34.0 pg 28.4  MCHC     30.0 - 36.0 g/dL 31.6  RDW     11.5 - 15.5 % 14.4  Platelets     150 - 400 K/uL 229  nRBC     0.0 - 0.2 % 0.0  Neutrophils     % 65  NEUT#     1.7 - 7.7 K/uL 4.1  Lymphocytes     % 20  Lymphocyte #     0.7 - 4.0 K/uL 1.3  Monocytes Relative     % 11  Monocyte #     0.1 - 1.0 K/uL 0.7  Eosinophil     % 3  Eosinophils Absolute     0.0 - 0.5 K/uL 0.2  Basophil     % 1  Basophils Absolute     0.0 - 0.1 K/uL 0.0  Immature Granulocytes     % 0  Abs Immature Granulocytes     0.00 - 0.07 K/uL 0.01  Sodium     135 - 145 mmol/L 143  Potassium     3.5 - 5.1 mmol/L 4.3  Chloride     98 - 111 mmol/L  105  CO2     22 - 32 mmol/L 28  Glucose     70 - 99  mg/dL 69 (L)  BUN     8 - 23 mg/dL 14  Creatinine     0.44 - 1.00 mg/dL 0.74  Calcium     8.9 - 10.3 mg/dL 9.1  Total Protein     6.5 - 8.1 g/dL 7.1  Albumin     3.5 - 5.0 g/dL 3.7  AST     15 - 41 U/L 21  ALT     0 - 44 U/L 20  Alkaline Phosphatase     38 - 126 U/L 129 (H)  Total Bilirubin     0.3 - 1.2 mg/dL <0.2 (L)  GFR, Est Non African American     >60 mL/min >60  GFR, Est African American     >60 mL/min >60  Anion gap     5 - 15 10  Anticardiolipin Ab,IgG,Qn     0 - 14 GPL U/mL <9  Anticardiolipin Ab,IgM,Qn     0 - 12 MPL U/mL 37 (H)  Anticardiolipin Ab,IgA,Qn     0 - 11 APL U/mL <9  Beta-2 Glycoprotein I Ab, IgG     0 - 20 GPI IgG units <9  Beta-2-Glycoprotein I IgM     0 - 32 GPI IgM units <9  Beta-2-Glycoprotein I IgA     0 - 25 GPI IgA units <9  PTT Lupus Anticoagulant     0.0 - 51.9 sec 30.1  DRVVT     0.0 - 47.0 sec 36.5  Lupus Anticoag Interp      Comment:  Recommendations-PTGENE:      Comment  Recommendations-F5LEID:      Comment (A)  Protein S, Total     60 - 150 % 91  Protein S-Functional     63 - 140 % 82  Protein C, Total     60 - 150 % 81  Protein C-Functional     73 - 180 % 108  Antithrombin Activity     75 - 120 % 90   Factor 5 leiden      No reference range information available      Resulting Lab: [facility]      Comments:           (NOTE)           RESULT: SINGLE R506Q MUTATION IDENTIFIED (HETEROZYGOTE)           Factor V Leiden is a specific mutation (R506Q) in the factor           V gene           that is associated with an increased risk of venous            thrombosis.           Factor V Leiden is more resistant to inactivation by            activated            A-- Heterozygous FVL mutation confirmed. -- Elevated Cardiolipin IgM ab levels at 37 --- concerning for possible antiphospholipid antibody syndrome. Rpt testing with PCP in 12 weeks  to confirm. PLAN -=continue life long anticoagulation as per recommendations noted above.

## 2019-08-22 LAB — PROTEIN C, TOTAL: Protein C, Total: 81 % (ref 60–150)

## 2019-08-23 LAB — PROTEIN C ACTIVITY: Protein C Activity: 108 % (ref 73–180)

## 2019-08-23 LAB — BETA-2-GLYCOPROTEIN I ABS, IGG/M/A
Beta-2 Glyco I IgG: <9 GPI IgG units (ref 0–20)
Beta-2-Glycoprotein I IgA: <9 GPI IgA units (ref 0–25)
Beta-2-Glycoprotein I IgM: <9 GPI IgM units (ref 0–32)

## 2019-08-23 LAB — LUPUS ANTICOAGULANT PANEL
DRVVT: 36.5 s (ref 0.0–47.0)
PTT Lupus Anticoagulant: 30.1 s (ref 0.0–51.9)

## 2019-08-23 LAB — PROTEIN S, TOTAL: Protein S Ag, Total: 91 % (ref 60–150)

## 2019-08-23 LAB — PROTEIN S ACTIVITY: Protein S Activity: 82 % (ref 63–140)

## 2019-08-24 LAB — CARDIOLIPIN ANTIBODIES, IGG, IGM, IGA
Anticardiolipin IgA: <9 U/mL (ref 0–11)
Anticardiolipin IgG: <9 GPL U/mL (ref 0–14)
Anticardiolipin IgM: 37 [MPL'U]/mL — ABNORMAL HIGH (ref 0–12)

## 2019-08-26 LAB — PROTHROMBIN GENE MUTATION

## 2019-08-26 LAB — FACTOR 5 LEIDEN

## 2019-09-03 ENCOUNTER — Ambulatory Visit
Admission: RE | Admit: 2019-09-03 | Discharge: 2019-09-03 | Disposition: A | Discharge status: Home / Self Care | Payer: Medicare Other | Source: Ambulatory Visit | Attending: Physician Assistant | Admitting: Physician Assistant

## 2019-09-03 ENCOUNTER — Other Ambulatory Visit: Payer: Self-pay

## 2019-09-03 DIAGNOSIS — Z1231 Encounter for screening mammogram for malignant neoplasm of breast: Secondary | ICD-10-CM | POA: Diagnosis not present

## 2019-10-02 ENCOUNTER — Ambulatory Visit
Admission: RE | Admit: 2019-10-02 | Discharge: 2019-10-02 | Disposition: A | Discharge status: Home / Self Care | Payer: Medicare Other | Source: Ambulatory Visit | Attending: Physician Assistant | Admitting: Physician Assistant

## 2019-10-02 ENCOUNTER — Other Ambulatory Visit: Payer: Self-pay

## 2019-10-02 DIAGNOSIS — M810 Age-related osteoporosis without current pathological fracture: Secondary | ICD-10-CM | POA: Diagnosis not present

## 2019-10-02 DIAGNOSIS — M85852 Other specified disorders of bone density and structure, left thigh: Secondary | ICD-10-CM | POA: Diagnosis not present

## 2019-10-02 DIAGNOSIS — Z13820 Encounter for screening for osteoporosis: Secondary | ICD-10-CM

## 2019-10-02 DIAGNOSIS — Z780 Asymptomatic menopausal state: Secondary | ICD-10-CM | POA: Diagnosis not present

## 2019-10-07 DIAGNOSIS — Z79899 Other long term (current) drug therapy: Secondary | ICD-10-CM | POA: Diagnosis not present

## 2019-10-07 DIAGNOSIS — H40013 Open angle with borderline findings, low risk, bilateral: Secondary | ICD-10-CM | POA: Diagnosis not present

## 2019-10-07 DIAGNOSIS — H35013 Changes in retinal vascular appearance, bilateral: Secondary | ICD-10-CM | POA: Diagnosis not present

## 2019-10-07 DIAGNOSIS — H35033 Hypertensive retinopathy, bilateral: Secondary | ICD-10-CM | POA: Diagnosis not present

## 2019-10-31 DIAGNOSIS — I10 Essential (primary) hypertension: Secondary | ICD-10-CM | POA: Diagnosis not present

## 2019-10-31 DIAGNOSIS — E785 Hyperlipidemia, unspecified: Secondary | ICD-10-CM | POA: Diagnosis not present

## 2019-10-31 DIAGNOSIS — R7301 Impaired fasting glucose: Secondary | ICD-10-CM | POA: Diagnosis not present

## 2019-10-31 DIAGNOSIS — D6851 Activated protein C resistance: Secondary | ICD-10-CM | POA: Diagnosis not present

## 2020-02-03 DIAGNOSIS — Z79899 Other long term (current) drug therapy: Secondary | ICD-10-CM | POA: Diagnosis not present

## 2020-02-03 DIAGNOSIS — L84 Corns and callosities: Secondary | ICD-10-CM | POA: Diagnosis not present

## 2020-02-03 DIAGNOSIS — D6859 Other primary thrombophilia: Secondary | ICD-10-CM | POA: Diagnosis not present

## 2020-02-03 DIAGNOSIS — R7301 Impaired fasting glucose: Secondary | ICD-10-CM | POA: Diagnosis not present

## 2020-02-03 DIAGNOSIS — Z23 Encounter for immunization: Secondary | ICD-10-CM | POA: Diagnosis not present

## 2020-02-03 DIAGNOSIS — D6851 Activated protein C resistance: Secondary | ICD-10-CM | POA: Diagnosis not present

## 2020-02-03 DIAGNOSIS — M810 Age-related osteoporosis without current pathological fracture: Secondary | ICD-10-CM | POA: Diagnosis not present

## 2020-02-03 DIAGNOSIS — E785 Hyperlipidemia, unspecified: Secondary | ICD-10-CM | POA: Diagnosis not present

## 2020-02-03 DIAGNOSIS — Z0000 Encounter for general adult medical examination without abnormal findings: Secondary | ICD-10-CM | POA: Diagnosis not present

## 2020-02-03 DIAGNOSIS — Z1211 Encounter for screening for malignant neoplasm of colon: Secondary | ICD-10-CM | POA: Diagnosis not present

## 2020-02-03 DIAGNOSIS — I10 Essential (primary) hypertension: Secondary | ICD-10-CM | POA: Diagnosis not present

## 2020-02-12 DIAGNOSIS — Z1211 Encounter for screening for malignant neoplasm of colon: Secondary | ICD-10-CM | POA: Diagnosis not present

## 2020-02-20 ENCOUNTER — Other Ambulatory Visit: Payer: Self-pay

## 2020-02-20 ENCOUNTER — Ambulatory Visit (INDEPENDENT_AMBULATORY_CARE_PROVIDER_SITE_OTHER): Payer: Medicare Other | Admitting: Podiatry

## 2020-02-20 DIAGNOSIS — L84 Corns and callosities: Secondary | ICD-10-CM

## 2020-02-20 DIAGNOSIS — I872 Venous insufficiency (chronic) (peripheral): Secondary | ICD-10-CM | POA: Diagnosis not present

## 2020-02-20 DIAGNOSIS — M79674 Pain in right toe(s): Secondary | ICD-10-CM

## 2020-02-20 DIAGNOSIS — Z7901 Long term (current) use of anticoagulants: Secondary | ICD-10-CM | POA: Diagnosis not present

## 2020-02-20 DIAGNOSIS — M2041 Other hammer toe(s) (acquired), right foot: Secondary | ICD-10-CM

## 2020-02-20 NOTE — Progress Notes (Signed)
  Subjective:  Patient ID: Jessica Orr, female    DOB: 01-17-1946,  MRN: [id number]  Chief Complaint  Patient presents with  . Callouses    R 4th and 5th toes. Pt stated, "I've had problems with corns on and off ever since I started walking. These corns have been there for about a year. Corn pads seem to intensify the problem. Pain = 9/10".    74 y.o. female presents with the above complaint. History confirmed with patient. The 4th and 5th toes rub together. She wears compression stockings bilaterally due to peripheral venous disease and this was recommended by Dr Donnetta Hutching and is helpful.  Objective:  Physical Exam: warm, good capillary refill, no trophic changes or ulcerative lesions, normal DP and PT pulses and normal sensory exam. Varicosities present B/L   Right Foot:  Digital contractures, adductovarus rotation of the 4th and 5th digits, heloma molle lateral 4th and medial 5th kissing lesion  Assessment:   1. Hammertoe of right foot   2. Callus of foot   3. Pain in toe of right foot   4. Long term current use of anticoagulant therapy   5. Edema of both lower extremities due to peripheral venous insufficiency      Plan:  Patient was evaluated and treated and all questions answered.  All symptomatic hyperkeratoses were safely debrided with a sterile #15 blade to patient's level of comfort without incident. We discussed preventative and palliative care of these lesions including supportive and accommodative shoegear, padding, prefabricated and custom molded accommodative orthoses, use of a pumice stone and lotions/creams daily.  I discussed the etiology of the heloma molle with her and that the source of this is due to the digital contractures. We will try to find a convenient offloading solution such as silicone pads which were dispensed today. We also discussed surgical intervention and that if non surgical methods are not successful we could pursue this, likely a combination  of arthroplasty, arthrodesis and tendon rebalancing.  Lanae Crumbly, DPM 02/20/2020    Return if symptoms worsen or fail to improve.

## 2020-02-28 DIAGNOSIS — H40013 Open angle with borderline findings, low risk, bilateral: Secondary | ICD-10-CM | POA: Diagnosis not present

## 2020-03-30 DIAGNOSIS — H40013 Open angle with borderline findings, low risk, bilateral: Secondary | ICD-10-CM | POA: Diagnosis not present

## 2020-04-17 ENCOUNTER — Other Ambulatory Visit: Payer: Self-pay | Admitting: Family Medicine

## 2020-04-17 ENCOUNTER — Ambulatory Visit
Admission: RE | Admit: 2020-04-17 | Discharge: 2020-04-17 | Disposition: A | Discharge status: Home / Self Care | Payer: Medicare Other | Source: Ambulatory Visit | Attending: Family Medicine | Admitting: Family Medicine

## 2020-04-17 ENCOUNTER — Other Ambulatory Visit: Payer: Self-pay

## 2020-04-17 DIAGNOSIS — M810 Age-related osteoporosis without current pathological fracture: Secondary | ICD-10-CM

## 2020-04-17 DIAGNOSIS — R269 Unspecified abnormalities of gait and mobility: Secondary | ICD-10-CM | POA: Diagnosis not present

## 2020-04-17 DIAGNOSIS — R5381 Other malaise: Secondary | ICD-10-CM | POA: Diagnosis not present

## 2020-04-17 DIAGNOSIS — Z742 Need for assistance at home and no other household member able to render care: Secondary | ICD-10-CM | POA: Diagnosis not present

## 2020-04-17 DIAGNOSIS — S22080A Wedge compression fracture of T11-T12 vertebra, initial encounter for closed fracture: Secondary | ICD-10-CM | POA: Diagnosis not present

## 2020-04-17 DIAGNOSIS — W19XXXA Unspecified fall, initial encounter: Secondary | ICD-10-CM | POA: Diagnosis not present

## 2020-04-17 DIAGNOSIS — M546 Pain in thoracic spine: Secondary | ICD-10-CM | POA: Diagnosis not present

## 2020-04-17 DIAGNOSIS — M47814 Spondylosis without myelopathy or radiculopathy, thoracic region: Secondary | ICD-10-CM | POA: Diagnosis not present

## 2020-04-17 DIAGNOSIS — Z23 Encounter for immunization: Secondary | ICD-10-CM | POA: Diagnosis not present

## 2020-04-17 DIAGNOSIS — I10 Essential (primary) hypertension: Secondary | ICD-10-CM | POA: Diagnosis not present

## 2020-04-17 DIAGNOSIS — S24109A Unspecified injury at unspecified level of thoracic spinal cord, initial encounter: Secondary | ICD-10-CM | POA: Diagnosis not present

## 2020-04-20 ENCOUNTER — Other Ambulatory Visit: Payer: Self-pay

## 2020-04-20 ENCOUNTER — Encounter: Payer: Self-pay | Admitting: Podiatry

## 2020-04-20 ENCOUNTER — Ambulatory Visit (INDEPENDENT_AMBULATORY_CARE_PROVIDER_SITE_OTHER): Payer: Medicare Other | Admitting: Podiatry

## 2020-04-20 DIAGNOSIS — M2041 Other hammer toe(s) (acquired), right foot: Secondary | ICD-10-CM

## 2020-04-20 DIAGNOSIS — L84 Corns and callosities: Secondary | ICD-10-CM | POA: Diagnosis not present

## 2020-04-20 NOTE — Progress Notes (Signed)
  Subjective:  Patient ID: Jessica Orr, female    DOB: May 17, 1946,  MRN: [id number]  Chief Complaint  Patient presents with  . Callouses    Pt stated that she is here for corn/callous on her right foot     74 y.o. female presents with the above complaint. History confirmed with patient. The 4th and 5th toes rub together. She wears compression stockings bilaterally due to peripheral venous disease and this was recommended by Dr Donnetta Hutching and is helpful.  The toe crest were not helpful.  The silicone offloading pads did not help either.  Objective:  Physical Exam: warm, good capillary refill, no trophic changes or ulcerative lesions, normal DP and PT pulses and normal sensory exam. Varicosities present B/L   Right Foot:  Digital contractures, adductovarus rotation of the 4th and 5th digits, heloma molle lateral 4th and medial 5th kissing lesion  Assessment:   1. Hammertoe of right foot   2. Callus of foot      Plan:  Patient was evaluated and treated and all questions answered.  All symptomatic hyperkeratoses were safely debrided with a sterile #15 blade to patient's level of comfort without incident. We discussed preventative and palliative care of these lesions including supportive and accommodative shoegear, padding, prefabricated and custom molded accommodative orthoses, use of a pumice stone and lotions/creams daily.  We again discussed the etiology and treatment options including surgical and non surgical treatment for painful hammertoes.  She has exhausted all non surgical treatment prior to this visit including shoe gear changes and padding.  She desires surgical intervention. We discussed all risks including but not limited to: pain, swelling, infection, scar, numbness which may be temporary or permanent, chronic pain, stiffness, nerve pain or damage, wound healing problems, bone healing problems including delayed or non-union and recurrence. Specifically we discussed the  following procedures: Arthroplasty of the right fourth and fifth toes with possible arthrodesis if necessary of the PIPJ of the fourth toe. Informed consent was signed today. Surgery will be scheduled at a mutually agreeable date. Information regarding this will be forwarded to our surgery scheduler.   Lanae Crumbly, DPM 04/20/2020    No follow-ups on file.

## 2020-04-27 ENCOUNTER — Ambulatory Visit (INDEPENDENT_AMBULATORY_CARE_PROVIDER_SITE_OTHER): Payer: Medicare Other | Admitting: Podiatry

## 2020-04-27 ENCOUNTER — Ambulatory Visit: Payer: Medicare Other

## 2020-04-27 ENCOUNTER — Other Ambulatory Visit: Payer: Self-pay

## 2020-04-27 DIAGNOSIS — D6851 Activated protein C resistance: Secondary | ICD-10-CM | POA: Diagnosis not present

## 2020-04-27 DIAGNOSIS — Z791 Long term (current) use of non-steroidal anti-inflammatories (NSAID): Secondary | ICD-10-CM | POA: Diagnosis not present

## 2020-04-27 DIAGNOSIS — Z9181 History of falling: Secondary | ICD-10-CM | POA: Diagnosis not present

## 2020-04-27 DIAGNOSIS — Z86711 Personal history of pulmonary embolism: Secondary | ICD-10-CM | POA: Diagnosis not present

## 2020-04-27 DIAGNOSIS — M810 Age-related osteoporosis without current pathological fracture: Secondary | ICD-10-CM | POA: Diagnosis not present

## 2020-04-27 DIAGNOSIS — J449 Chronic obstructive pulmonary disease, unspecified: Secondary | ICD-10-CM | POA: Diagnosis not present

## 2020-04-27 DIAGNOSIS — Z8612 Personal history of poliomyelitis: Secondary | ICD-10-CM | POA: Diagnosis not present

## 2020-04-27 DIAGNOSIS — M2041 Other hammer toe(s) (acquired), right foot: Secondary | ICD-10-CM

## 2020-04-27 DIAGNOSIS — M329 Systemic lupus erythematosus, unspecified: Secondary | ICD-10-CM | POA: Diagnosis not present

## 2020-04-27 DIAGNOSIS — I10 Essential (primary) hypertension: Secondary | ICD-10-CM | POA: Diagnosis not present

## 2020-04-27 DIAGNOSIS — Z90710 Acquired absence of both cervix and uterus: Secondary | ICD-10-CM | POA: Diagnosis not present

## 2020-04-27 DIAGNOSIS — W19XXXD Unspecified fall, subsequent encounter: Secondary | ICD-10-CM | POA: Diagnosis not present

## 2020-04-27 DIAGNOSIS — Z8542 Personal history of malignant neoplasm of other parts of uterus: Secondary | ICD-10-CM | POA: Diagnosis not present

## 2020-04-27 DIAGNOSIS — E785 Hyperlipidemia, unspecified: Secondary | ICD-10-CM | POA: Diagnosis not present

## 2020-04-27 DIAGNOSIS — M546 Pain in thoracic spine: Secondary | ICD-10-CM | POA: Diagnosis not present

## 2020-04-27 DIAGNOSIS — Z7901 Long term (current) use of anticoagulants: Secondary | ICD-10-CM | POA: Diagnosis not present

## 2020-04-27 DIAGNOSIS — Z87442 Personal history of urinary calculi: Secondary | ICD-10-CM | POA: Diagnosis not present

## 2020-04-27 DIAGNOSIS — Z86718 Personal history of other venous thrombosis and embolism: Secondary | ICD-10-CM | POA: Diagnosis not present

## 2020-04-27 DIAGNOSIS — Z87891 Personal history of nicotine dependence: Secondary | ICD-10-CM | POA: Diagnosis not present

## 2020-05-04 DIAGNOSIS — I10 Essential (primary) hypertension: Secondary | ICD-10-CM | POA: Diagnosis not present

## 2020-05-04 DIAGNOSIS — M546 Pain in thoracic spine: Secondary | ICD-10-CM | POA: Diagnosis not present

## 2020-05-04 DIAGNOSIS — M810 Age-related osteoporosis without current pathological fracture: Secondary | ICD-10-CM | POA: Diagnosis not present

## 2020-05-04 DIAGNOSIS — J449 Chronic obstructive pulmonary disease, unspecified: Secondary | ICD-10-CM | POA: Diagnosis not present

## 2020-05-04 DIAGNOSIS — E785 Hyperlipidemia, unspecified: Secondary | ICD-10-CM | POA: Diagnosis not present

## 2020-05-04 DIAGNOSIS — S22080A Wedge compression fracture of T11-T12 vertebra, initial encounter for closed fracture: Secondary | ICD-10-CM | POA: Diagnosis not present

## 2020-05-04 DIAGNOSIS — M329 Systemic lupus erythematosus, unspecified: Secondary | ICD-10-CM | POA: Diagnosis not present

## 2020-05-05 DIAGNOSIS — M329 Systemic lupus erythematosus, unspecified: Secondary | ICD-10-CM | POA: Diagnosis not present

## 2020-05-05 DIAGNOSIS — J449 Chronic obstructive pulmonary disease, unspecified: Secondary | ICD-10-CM | POA: Diagnosis not present

## 2020-05-05 DIAGNOSIS — E785 Hyperlipidemia, unspecified: Secondary | ICD-10-CM | POA: Diagnosis not present

## 2020-05-05 DIAGNOSIS — M810 Age-related osteoporosis without current pathological fracture: Secondary | ICD-10-CM | POA: Diagnosis not present

## 2020-05-05 DIAGNOSIS — I10 Essential (primary) hypertension: Secondary | ICD-10-CM | POA: Diagnosis not present

## 2020-05-05 DIAGNOSIS — M546 Pain in thoracic spine: Secondary | ICD-10-CM | POA: Diagnosis not present

## 2020-05-07 DIAGNOSIS — M546 Pain in thoracic spine: Secondary | ICD-10-CM | POA: Diagnosis not present

## 2020-05-07 DIAGNOSIS — M329 Systemic lupus erythematosus, unspecified: Secondary | ICD-10-CM | POA: Diagnosis not present

## 2020-05-07 DIAGNOSIS — J449 Chronic obstructive pulmonary disease, unspecified: Secondary | ICD-10-CM | POA: Diagnosis not present

## 2020-05-07 DIAGNOSIS — M810 Age-related osteoporosis without current pathological fracture: Secondary | ICD-10-CM | POA: Diagnosis not present

## 2020-05-07 DIAGNOSIS — E785 Hyperlipidemia, unspecified: Secondary | ICD-10-CM | POA: Diagnosis not present

## 2020-05-07 DIAGNOSIS — I10 Essential (primary) hypertension: Secondary | ICD-10-CM | POA: Diagnosis not present

## 2020-05-11 DIAGNOSIS — I10 Essential (primary) hypertension: Secondary | ICD-10-CM | POA: Diagnosis not present

## 2020-05-11 DIAGNOSIS — J449 Chronic obstructive pulmonary disease, unspecified: Secondary | ICD-10-CM | POA: Diagnosis not present

## 2020-05-11 DIAGNOSIS — M546 Pain in thoracic spine: Secondary | ICD-10-CM | POA: Diagnosis not present

## 2020-05-11 DIAGNOSIS — M810 Age-related osteoporosis without current pathological fracture: Secondary | ICD-10-CM | POA: Diagnosis not present

## 2020-05-11 DIAGNOSIS — M329 Systemic lupus erythematosus, unspecified: Secondary | ICD-10-CM | POA: Diagnosis not present

## 2020-05-11 DIAGNOSIS — E785 Hyperlipidemia, unspecified: Secondary | ICD-10-CM | POA: Diagnosis not present

## 2020-05-12 ENCOUNTER — Encounter: Payer: Self-pay | Admitting: Podiatry

## 2020-05-12 DIAGNOSIS — I10 Essential (primary) hypertension: Secondary | ICD-10-CM | POA: Diagnosis not present

## 2020-05-12 DIAGNOSIS — M329 Systemic lupus erythematosus, unspecified: Secondary | ICD-10-CM | POA: Diagnosis not present

## 2020-05-12 DIAGNOSIS — J449 Chronic obstructive pulmonary disease, unspecified: Secondary | ICD-10-CM | POA: Diagnosis not present

## 2020-05-12 DIAGNOSIS — M546 Pain in thoracic spine: Secondary | ICD-10-CM | POA: Diagnosis not present

## 2020-05-12 DIAGNOSIS — M810 Age-related osteoporosis without current pathological fracture: Secondary | ICD-10-CM | POA: Diagnosis not present

## 2020-05-12 DIAGNOSIS — E785 Hyperlipidemia, unspecified: Secondary | ICD-10-CM | POA: Diagnosis not present

## 2020-05-14 DIAGNOSIS — M810 Age-related osteoporosis without current pathological fracture: Secondary | ICD-10-CM | POA: Diagnosis not present

## 2020-05-14 DIAGNOSIS — J449 Chronic obstructive pulmonary disease, unspecified: Secondary | ICD-10-CM | POA: Diagnosis not present

## 2020-05-14 DIAGNOSIS — E785 Hyperlipidemia, unspecified: Secondary | ICD-10-CM | POA: Diagnosis not present

## 2020-05-14 DIAGNOSIS — I10 Essential (primary) hypertension: Secondary | ICD-10-CM | POA: Diagnosis not present

## 2020-05-14 DIAGNOSIS — M546 Pain in thoracic spine: Secondary | ICD-10-CM | POA: Diagnosis not present

## 2020-05-14 DIAGNOSIS — M329 Systemic lupus erythematosus, unspecified: Secondary | ICD-10-CM | POA: Diagnosis not present

## 2020-05-18 DIAGNOSIS — M546 Pain in thoracic spine: Secondary | ICD-10-CM | POA: Diagnosis not present

## 2020-05-18 DIAGNOSIS — S22080A Wedge compression fracture of T11-T12 vertebra, initial encounter for closed fracture: Secondary | ICD-10-CM | POA: Diagnosis not present

## 2020-05-19 ENCOUNTER — Telehealth: Payer: Self-pay

## 2020-05-19 DIAGNOSIS — J449 Chronic obstructive pulmonary disease, unspecified: Secondary | ICD-10-CM | POA: Diagnosis not present

## 2020-05-19 DIAGNOSIS — M810 Age-related osteoporosis without current pathological fracture: Secondary | ICD-10-CM | POA: Diagnosis not present

## 2020-05-19 DIAGNOSIS — E785 Hyperlipidemia, unspecified: Secondary | ICD-10-CM | POA: Diagnosis not present

## 2020-05-19 DIAGNOSIS — M329 Systemic lupus erythematosus, unspecified: Secondary | ICD-10-CM | POA: Diagnosis not present

## 2020-05-19 DIAGNOSIS — M546 Pain in thoracic spine: Secondary | ICD-10-CM | POA: Diagnosis not present

## 2020-05-19 DIAGNOSIS — I10 Essential (primary) hypertension: Secondary | ICD-10-CM | POA: Diagnosis not present

## 2020-05-19 NOTE — Telephone Encounter (Signed)
Understandable, thanks for the update!

## 2020-05-19 NOTE — Telephone Encounter (Signed)
I spoke to [facility] about her upcoming surgery on 05/22/2020. She stated she would like to cancel her surgery because she has a lot of concerns and doesn't want to deal with it right now. I told her that I would cancel her surgery and for her to call us if she wanted to reschedule. Notified Cynthia with [facility] and Dr. Sherryle Lis.

## 2020-05-20 DIAGNOSIS — M810 Age-related osteoporosis without current pathological fracture: Secondary | ICD-10-CM | POA: Diagnosis not present

## 2020-05-20 DIAGNOSIS — J449 Chronic obstructive pulmonary disease, unspecified: Secondary | ICD-10-CM | POA: Diagnosis not present

## 2020-05-20 DIAGNOSIS — E785 Hyperlipidemia, unspecified: Secondary | ICD-10-CM | POA: Diagnosis not present

## 2020-05-20 DIAGNOSIS — I10 Essential (primary) hypertension: Secondary | ICD-10-CM | POA: Diagnosis not present

## 2020-05-20 DIAGNOSIS — M546 Pain in thoracic spine: Secondary | ICD-10-CM | POA: Diagnosis not present

## 2020-05-20 DIAGNOSIS — M329 Systemic lupus erythematosus, unspecified: Secondary | ICD-10-CM | POA: Diagnosis not present

## 2020-05-21 DIAGNOSIS — J449 Chronic obstructive pulmonary disease, unspecified: Secondary | ICD-10-CM | POA: Diagnosis not present

## 2020-05-21 DIAGNOSIS — M329 Systemic lupus erythematosus, unspecified: Secondary | ICD-10-CM | POA: Diagnosis not present

## 2020-05-21 DIAGNOSIS — E785 Hyperlipidemia, unspecified: Secondary | ICD-10-CM | POA: Diagnosis not present

## 2020-05-21 DIAGNOSIS — M810 Age-related osteoporosis without current pathological fracture: Secondary | ICD-10-CM | POA: Diagnosis not present

## 2020-05-21 DIAGNOSIS — I10 Essential (primary) hypertension: Secondary | ICD-10-CM | POA: Diagnosis not present

## 2020-05-21 DIAGNOSIS — M546 Pain in thoracic spine: Secondary | ICD-10-CM | POA: Diagnosis not present

## 2020-05-22 ENCOUNTER — Encounter: Payer: Medicare Other | Admitting: Podiatry

## 2020-05-26 DIAGNOSIS — I10 Essential (primary) hypertension: Secondary | ICD-10-CM | POA: Diagnosis not present

## 2020-05-26 DIAGNOSIS — J449 Chronic obstructive pulmonary disease, unspecified: Secondary | ICD-10-CM | POA: Diagnosis not present

## 2020-05-26 DIAGNOSIS — M329 Systemic lupus erythematosus, unspecified: Secondary | ICD-10-CM | POA: Diagnosis not present

## 2020-05-26 DIAGNOSIS — M810 Age-related osteoporosis without current pathological fracture: Secondary | ICD-10-CM | POA: Diagnosis not present

## 2020-05-26 DIAGNOSIS — M546 Pain in thoracic spine: Secondary | ICD-10-CM | POA: Diagnosis not present

## 2020-05-26 DIAGNOSIS — E785 Hyperlipidemia, unspecified: Secondary | ICD-10-CM | POA: Diagnosis not present

## 2020-05-27 DIAGNOSIS — W19XXXD Unspecified fall, subsequent encounter: Secondary | ICD-10-CM | POA: Diagnosis not present

## 2020-05-27 DIAGNOSIS — Z791 Long term (current) use of non-steroidal anti-inflammatories (NSAID): Secondary | ICD-10-CM | POA: Diagnosis not present

## 2020-05-27 DIAGNOSIS — Z8612 Personal history of poliomyelitis: Secondary | ICD-10-CM | POA: Diagnosis not present

## 2020-05-27 DIAGNOSIS — J449 Chronic obstructive pulmonary disease, unspecified: Secondary | ICD-10-CM | POA: Diagnosis not present

## 2020-05-27 DIAGNOSIS — E785 Hyperlipidemia, unspecified: Secondary | ICD-10-CM | POA: Diagnosis not present

## 2020-05-27 DIAGNOSIS — Z9181 History of falling: Secondary | ICD-10-CM | POA: Diagnosis not present

## 2020-05-27 DIAGNOSIS — M810 Age-related osteoporosis without current pathological fracture: Secondary | ICD-10-CM | POA: Diagnosis not present

## 2020-05-27 DIAGNOSIS — Z87891 Personal history of nicotine dependence: Secondary | ICD-10-CM | POA: Diagnosis not present

## 2020-05-27 DIAGNOSIS — Z87442 Personal history of urinary calculi: Secondary | ICD-10-CM | POA: Diagnosis not present

## 2020-05-27 DIAGNOSIS — I10 Essential (primary) hypertension: Secondary | ICD-10-CM | POA: Diagnosis not present

## 2020-05-27 DIAGNOSIS — M329 Systemic lupus erythematosus, unspecified: Secondary | ICD-10-CM | POA: Diagnosis not present

## 2020-05-27 DIAGNOSIS — Z86711 Personal history of pulmonary embolism: Secondary | ICD-10-CM | POA: Diagnosis not present

## 2020-05-27 DIAGNOSIS — D6851 Activated protein C resistance: Secondary | ICD-10-CM | POA: Diagnosis not present

## 2020-05-27 DIAGNOSIS — Z8542 Personal history of malignant neoplasm of other parts of uterus: Secondary | ICD-10-CM | POA: Diagnosis not present

## 2020-05-27 DIAGNOSIS — Z86718 Personal history of other venous thrombosis and embolism: Secondary | ICD-10-CM | POA: Diagnosis not present

## 2020-05-27 DIAGNOSIS — M546 Pain in thoracic spine: Secondary | ICD-10-CM | POA: Diagnosis not present

## 2020-05-27 DIAGNOSIS — Z90710 Acquired absence of both cervix and uterus: Secondary | ICD-10-CM | POA: Diagnosis not present

## 2020-05-27 DIAGNOSIS — Z7901 Long term (current) use of anticoagulants: Secondary | ICD-10-CM | POA: Diagnosis not present

## 2020-05-28 DIAGNOSIS — I10 Essential (primary) hypertension: Secondary | ICD-10-CM | POA: Diagnosis not present

## 2020-05-28 DIAGNOSIS — J449 Chronic obstructive pulmonary disease, unspecified: Secondary | ICD-10-CM | POA: Diagnosis not present

## 2020-05-28 DIAGNOSIS — M329 Systemic lupus erythematosus, unspecified: Secondary | ICD-10-CM | POA: Diagnosis not present

## 2020-05-28 DIAGNOSIS — M810 Age-related osteoporosis without current pathological fracture: Secondary | ICD-10-CM | POA: Diagnosis not present

## 2020-05-28 DIAGNOSIS — M546 Pain in thoracic spine: Secondary | ICD-10-CM | POA: Diagnosis not present

## 2020-05-28 DIAGNOSIS — E785 Hyperlipidemia, unspecified: Secondary | ICD-10-CM | POA: Diagnosis not present

## 2020-05-29 ENCOUNTER — Encounter: Payer: Medicare Other | Admitting: Podiatry

## 2020-06-01 ENCOUNTER — Other Ambulatory Visit: Payer: Self-pay | Admitting: Physician Assistant

## 2020-06-01 ENCOUNTER — Other Ambulatory Visit: Payer: Self-pay | Admitting: Gastroenterology

## 2020-06-01 DIAGNOSIS — R1310 Dysphagia, unspecified: Secondary | ICD-10-CM | POA: Diagnosis not present

## 2020-06-01 DIAGNOSIS — K2100 Gastro-esophageal reflux disease with esophagitis, without bleeding: Secondary | ICD-10-CM | POA: Diagnosis not present

## 2020-06-09 ENCOUNTER — Ambulatory Visit
Admission: RE | Admit: 2020-06-09 | Discharge: 2020-06-09 | Disposition: A | Discharge status: Home / Self Care | Payer: Medicare Other | Source: Ambulatory Visit | Attending: Physician Assistant | Admitting: Physician Assistant

## 2020-06-09 ENCOUNTER — Encounter: Payer: Medicare Other | Admitting: Podiatry

## 2020-06-09 ENCOUNTER — Other Ambulatory Visit: Payer: Self-pay | Admitting: Physician Assistant

## 2020-06-09 DIAGNOSIS — R1310 Dysphagia, unspecified: Secondary | ICD-10-CM

## 2020-06-09 DIAGNOSIS — K219 Gastro-esophageal reflux disease without esophagitis: Secondary | ICD-10-CM | POA: Diagnosis not present

## 2020-06-10 DIAGNOSIS — J449 Chronic obstructive pulmonary disease, unspecified: Secondary | ICD-10-CM | POA: Diagnosis not present

## 2020-06-10 DIAGNOSIS — M810 Age-related osteoporosis without current pathological fracture: Secondary | ICD-10-CM | POA: Diagnosis not present

## 2020-06-10 DIAGNOSIS — M329 Systemic lupus erythematosus, unspecified: Secondary | ICD-10-CM | POA: Diagnosis not present

## 2020-06-10 DIAGNOSIS — E785 Hyperlipidemia, unspecified: Secondary | ICD-10-CM | POA: Diagnosis not present

## 2020-06-10 DIAGNOSIS — M546 Pain in thoracic spine: Secondary | ICD-10-CM | POA: Diagnosis not present

## 2020-06-10 DIAGNOSIS — I10 Essential (primary) hypertension: Secondary | ICD-10-CM | POA: Diagnosis not present

## 2020-06-11 DIAGNOSIS — M810 Age-related osteoporosis without current pathological fracture: Secondary | ICD-10-CM | POA: Diagnosis not present

## 2020-06-11 DIAGNOSIS — I10 Essential (primary) hypertension: Secondary | ICD-10-CM | POA: Diagnosis not present

## 2020-06-11 DIAGNOSIS — M329 Systemic lupus erythematosus, unspecified: Secondary | ICD-10-CM | POA: Diagnosis not present

## 2020-06-11 DIAGNOSIS — E785 Hyperlipidemia, unspecified: Secondary | ICD-10-CM | POA: Diagnosis not present

## 2020-06-11 DIAGNOSIS — J449 Chronic obstructive pulmonary disease, unspecified: Secondary | ICD-10-CM | POA: Diagnosis not present

## 2020-06-11 DIAGNOSIS — M546 Pain in thoracic spine: Secondary | ICD-10-CM | POA: Diagnosis not present

## 2020-06-12 ENCOUNTER — Encounter: Payer: Medicare Other | Admitting: Podiatry

## 2020-06-15 DIAGNOSIS — S22080A Wedge compression fracture of T11-T12 vertebra, initial encounter for closed fracture: Secondary | ICD-10-CM | POA: Diagnosis not present

## 2020-06-16 DIAGNOSIS — M546 Pain in thoracic spine: Secondary | ICD-10-CM | POA: Diagnosis not present

## 2020-06-16 DIAGNOSIS — E785 Hyperlipidemia, unspecified: Secondary | ICD-10-CM | POA: Diagnosis not present

## 2020-06-16 DIAGNOSIS — M810 Age-related osteoporosis without current pathological fracture: Secondary | ICD-10-CM | POA: Diagnosis not present

## 2020-06-16 DIAGNOSIS — I10 Essential (primary) hypertension: Secondary | ICD-10-CM | POA: Diagnosis not present

## 2020-06-16 DIAGNOSIS — M329 Systemic lupus erythematosus, unspecified: Secondary | ICD-10-CM | POA: Diagnosis not present

## 2020-06-16 DIAGNOSIS — J449 Chronic obstructive pulmonary disease, unspecified: Secondary | ICD-10-CM | POA: Diagnosis not present

## 2020-06-23 ENCOUNTER — Encounter: Payer: Medicare Other | Admitting: Podiatry

## 2020-06-23 DIAGNOSIS — I10 Essential (primary) hypertension: Secondary | ICD-10-CM | POA: Diagnosis not present

## 2020-06-23 DIAGNOSIS — M546 Pain in thoracic spine: Secondary | ICD-10-CM | POA: Diagnosis not present

## 2020-06-23 DIAGNOSIS — M810 Age-related osteoporosis without current pathological fracture: Secondary | ICD-10-CM | POA: Diagnosis not present

## 2020-06-23 DIAGNOSIS — E785 Hyperlipidemia, unspecified: Secondary | ICD-10-CM | POA: Diagnosis not present

## 2020-06-23 DIAGNOSIS — J449 Chronic obstructive pulmonary disease, unspecified: Secondary | ICD-10-CM | POA: Diagnosis not present

## 2020-06-23 DIAGNOSIS — M329 Systemic lupus erythematosus, unspecified: Secondary | ICD-10-CM | POA: Diagnosis not present

## 2020-06-24 DIAGNOSIS — M329 Systemic lupus erythematosus, unspecified: Secondary | ICD-10-CM | POA: Diagnosis not present

## 2020-06-24 DIAGNOSIS — M810 Age-related osteoporosis without current pathological fracture: Secondary | ICD-10-CM | POA: Diagnosis not present

## 2020-06-24 DIAGNOSIS — M546 Pain in thoracic spine: Secondary | ICD-10-CM | POA: Diagnosis not present

## 2020-06-24 DIAGNOSIS — E785 Hyperlipidemia, unspecified: Secondary | ICD-10-CM | POA: Diagnosis not present

## 2020-06-24 DIAGNOSIS — I10 Essential (primary) hypertension: Secondary | ICD-10-CM | POA: Diagnosis not present

## 2020-06-24 DIAGNOSIS — J449 Chronic obstructive pulmonary disease, unspecified: Secondary | ICD-10-CM | POA: Diagnosis not present

## 2020-06-26 DIAGNOSIS — Z9181 History of falling: Secondary | ICD-10-CM | POA: Diagnosis not present

## 2020-06-26 DIAGNOSIS — Z791 Long term (current) use of non-steroidal anti-inflammatories (NSAID): Secondary | ICD-10-CM | POA: Diagnosis not present

## 2020-06-26 DIAGNOSIS — M329 Systemic lupus erythematosus, unspecified: Secondary | ICD-10-CM | POA: Diagnosis not present

## 2020-06-26 DIAGNOSIS — I10 Essential (primary) hypertension: Secondary | ICD-10-CM | POA: Diagnosis not present

## 2020-06-26 DIAGNOSIS — Z7901 Long term (current) use of anticoagulants: Secondary | ICD-10-CM | POA: Diagnosis not present

## 2020-06-26 DIAGNOSIS — D6851 Activated protein C resistance: Secondary | ICD-10-CM | POA: Diagnosis not present

## 2020-06-26 DIAGNOSIS — Z86711 Personal history of pulmonary embolism: Secondary | ICD-10-CM | POA: Diagnosis not present

## 2020-06-26 DIAGNOSIS — Z87442 Personal history of urinary calculi: Secondary | ICD-10-CM | POA: Diagnosis not present

## 2020-06-26 DIAGNOSIS — M810 Age-related osteoporosis without current pathological fracture: Secondary | ICD-10-CM | POA: Diagnosis not present

## 2020-06-26 DIAGNOSIS — E785 Hyperlipidemia, unspecified: Secondary | ICD-10-CM | POA: Diagnosis not present

## 2020-06-26 DIAGNOSIS — J449 Chronic obstructive pulmonary disease, unspecified: Secondary | ICD-10-CM | POA: Diagnosis not present

## 2020-06-26 DIAGNOSIS — Z79899 Other long term (current) drug therapy: Secondary | ICD-10-CM | POA: Diagnosis not present

## 2020-06-26 DIAGNOSIS — Z86718 Personal history of other venous thrombosis and embolism: Secondary | ICD-10-CM | POA: Diagnosis not present

## 2020-06-26 DIAGNOSIS — Z90710 Acquired absence of both cervix and uterus: Secondary | ICD-10-CM | POA: Diagnosis not present

## 2020-06-26 DIAGNOSIS — M546 Pain in thoracic spine: Secondary | ICD-10-CM | POA: Diagnosis not present

## 2020-06-26 DIAGNOSIS — Z8542 Personal history of malignant neoplasm of other parts of uterus: Secondary | ICD-10-CM | POA: Diagnosis not present

## 2020-06-26 DIAGNOSIS — Z8612 Personal history of poliomyelitis: Secondary | ICD-10-CM | POA: Diagnosis not present

## 2020-06-26 DIAGNOSIS — Z87891 Personal history of nicotine dependence: Secondary | ICD-10-CM | POA: Diagnosis not present

## 2020-06-30 DIAGNOSIS — M810 Age-related osteoporosis without current pathological fracture: Secondary | ICD-10-CM | POA: Diagnosis not present

## 2020-06-30 DIAGNOSIS — I10 Essential (primary) hypertension: Secondary | ICD-10-CM | POA: Diagnosis not present

## 2020-06-30 DIAGNOSIS — M546 Pain in thoracic spine: Secondary | ICD-10-CM | POA: Diagnosis not present

## 2020-06-30 DIAGNOSIS — E785 Hyperlipidemia, unspecified: Secondary | ICD-10-CM | POA: Diagnosis not present

## 2020-06-30 DIAGNOSIS — M329 Systemic lupus erythematosus, unspecified: Secondary | ICD-10-CM | POA: Diagnosis not present

## 2020-06-30 DIAGNOSIS — J449 Chronic obstructive pulmonary disease, unspecified: Secondary | ICD-10-CM | POA: Diagnosis not present

## 2020-07-01 DIAGNOSIS — M810 Age-related osteoporosis without current pathological fracture: Secondary | ICD-10-CM | POA: Diagnosis not present

## 2020-07-01 DIAGNOSIS — M546 Pain in thoracic spine: Secondary | ICD-10-CM | POA: Diagnosis not present

## 2020-07-01 DIAGNOSIS — J449 Chronic obstructive pulmonary disease, unspecified: Secondary | ICD-10-CM | POA: Diagnosis not present

## 2020-07-01 DIAGNOSIS — E785 Hyperlipidemia, unspecified: Secondary | ICD-10-CM | POA: Diagnosis not present

## 2020-07-01 DIAGNOSIS — M329 Systemic lupus erythematosus, unspecified: Secondary | ICD-10-CM | POA: Diagnosis not present

## 2020-07-01 DIAGNOSIS — I10 Essential (primary) hypertension: Secondary | ICD-10-CM | POA: Diagnosis not present

## 2020-07-08 DIAGNOSIS — E785 Hyperlipidemia, unspecified: Secondary | ICD-10-CM | POA: Diagnosis not present

## 2020-07-08 DIAGNOSIS — I10 Essential (primary) hypertension: Secondary | ICD-10-CM | POA: Diagnosis not present

## 2020-07-08 DIAGNOSIS — M329 Systemic lupus erythematosus, unspecified: Secondary | ICD-10-CM | POA: Diagnosis not present

## 2020-07-08 DIAGNOSIS — J449 Chronic obstructive pulmonary disease, unspecified: Secondary | ICD-10-CM | POA: Diagnosis not present

## 2020-07-08 DIAGNOSIS — M546 Pain in thoracic spine: Secondary | ICD-10-CM | POA: Diagnosis not present

## 2020-07-08 DIAGNOSIS — M810 Age-related osteoporosis without current pathological fracture: Secondary | ICD-10-CM | POA: Diagnosis not present

## 2020-07-09 ENCOUNTER — Other Ambulatory Visit (HOSPITAL_COMMUNITY)
Admission: RE | Admit: 2020-07-09 | Discharge: 2020-07-09 | Disposition: A | Discharge status: Home / Self Care | Payer: Medicare Other | Source: Ambulatory Visit | Attending: Gastroenterology | Admitting: Gastroenterology

## 2020-07-09 DIAGNOSIS — Z01812 Encounter for preprocedural laboratory examination: Secondary | ICD-10-CM | POA: Insufficient documentation

## 2020-07-09 DIAGNOSIS — Z20822 Contact with and (suspected) exposure to covid-19: Secondary | ICD-10-CM | POA: Diagnosis not present

## 2020-07-09 LAB — SARS CORONAVIRUS 2 (TAT 6-24 HRS): SARS Coronavirus 2: NEGATIVE

## 2020-07-13 ENCOUNTER — Encounter (HOSPITAL_COMMUNITY)
Admission: RE | Disposition: A | Discharge status: Home / Self Care | Payer: Self-pay | Source: Home / Self Care | Attending: Gastroenterology

## 2020-07-13 ENCOUNTER — Ambulatory Visit (HOSPITAL_COMMUNITY)
Admission: RE | Admit: 2020-07-13 | Discharge: 2020-07-13 | Disposition: A | Discharge status: Home / Self Care | Payer: Medicare Other | Attending: Gastroenterology | Admitting: Gastroenterology

## 2020-07-13 DIAGNOSIS — K219 Gastro-esophageal reflux disease without esophagitis: Secondary | ICD-10-CM | POA: Insufficient documentation

## 2020-07-13 DIAGNOSIS — R1310 Dysphagia, unspecified: Secondary | ICD-10-CM | POA: Insufficient documentation

## 2020-07-13 HISTORY — PX: ESOPHAGEAL MANOMETRY: SHX5429

## 2020-07-13 SURGERY — MANOMETRY, ESOPHAGUS

## 2020-07-13 MED ORDER — LIDOCAINE HCL URETHRAL/MUCOSAL 2 % EX GEL
CUTANEOUS | Status: AC
  Filled 2020-07-13: qty 30

## 2020-07-13 SURGICAL SUPPLY — 2 items
FACESHIELD LNG OPTICON STERILE (SAFETY) IMPLANT
GLOVE BIO SURGEON STRL SZ8 (GLOVE) ×4 IMPLANT

## 2020-07-13 NOTE — Progress Notes (Signed)
Esophageal manometry performed per protocol without complications.  Patient tolerated well. 

## 2020-07-15 ENCOUNTER — Encounter (HOSPITAL_COMMUNITY): Payer: Self-pay | Admitting: Gastroenterology

## 2020-07-15 DIAGNOSIS — M329 Systemic lupus erythematosus, unspecified: Secondary | ICD-10-CM | POA: Diagnosis not present

## 2020-07-15 DIAGNOSIS — M546 Pain in thoracic spine: Secondary | ICD-10-CM | POA: Diagnosis not present

## 2020-07-15 DIAGNOSIS — I10 Essential (primary) hypertension: Secondary | ICD-10-CM | POA: Diagnosis not present

## 2020-07-15 DIAGNOSIS — M810 Age-related osteoporosis without current pathological fracture: Secondary | ICD-10-CM | POA: Diagnosis not present

## 2020-07-15 DIAGNOSIS — J449 Chronic obstructive pulmonary disease, unspecified: Secondary | ICD-10-CM | POA: Diagnosis not present

## 2020-07-15 DIAGNOSIS — E785 Hyperlipidemia, unspecified: Secondary | ICD-10-CM | POA: Diagnosis not present

## 2020-07-16 DIAGNOSIS — M810 Age-related osteoporosis without current pathological fracture: Secondary | ICD-10-CM | POA: Diagnosis not present

## 2020-07-16 DIAGNOSIS — E785 Hyperlipidemia, unspecified: Secondary | ICD-10-CM | POA: Diagnosis not present

## 2020-07-16 DIAGNOSIS — J449 Chronic obstructive pulmonary disease, unspecified: Secondary | ICD-10-CM | POA: Diagnosis not present

## 2020-07-16 DIAGNOSIS — I10 Essential (primary) hypertension: Secondary | ICD-10-CM | POA: Diagnosis not present

## 2020-07-16 DIAGNOSIS — M329 Systemic lupus erythematosus, unspecified: Secondary | ICD-10-CM | POA: Diagnosis not present

## 2020-07-16 DIAGNOSIS — M546 Pain in thoracic spine: Secondary | ICD-10-CM | POA: Diagnosis not present

## 2020-07-21 DIAGNOSIS — J449 Chronic obstructive pulmonary disease, unspecified: Secondary | ICD-10-CM | POA: Diagnosis not present

## 2020-07-21 DIAGNOSIS — R7303 Prediabetes: Secondary | ICD-10-CM | POA: Diagnosis not present

## 2020-07-21 DIAGNOSIS — Z9181 History of falling: Secondary | ICD-10-CM | POA: Diagnosis not present

## 2020-07-21 DIAGNOSIS — M546 Pain in thoracic spine: Secondary | ICD-10-CM | POA: Diagnosis not present

## 2020-07-21 DIAGNOSIS — E785 Hyperlipidemia, unspecified: Secondary | ICD-10-CM | POA: Diagnosis not present

## 2020-07-21 DIAGNOSIS — Z7185 Encounter for immunization safety counseling: Secondary | ICD-10-CM | POA: Diagnosis not present

## 2020-07-21 DIAGNOSIS — M810 Age-related osteoporosis without current pathological fracture: Secondary | ICD-10-CM | POA: Diagnosis not present

## 2020-07-21 DIAGNOSIS — I10 Essential (primary) hypertension: Secondary | ICD-10-CM | POA: Diagnosis not present

## 2020-07-21 DIAGNOSIS — D6851 Activated protein C resistance: Secondary | ICD-10-CM | POA: Diagnosis not present

## 2020-07-21 DIAGNOSIS — Z7901 Long term (current) use of anticoagulants: Secondary | ICD-10-CM | POA: Diagnosis not present

## 2020-07-21 DIAGNOSIS — M329 Systemic lupus erythematosus, unspecified: Secondary | ICD-10-CM | POA: Diagnosis not present

## 2020-07-23 DIAGNOSIS — E785 Hyperlipidemia, unspecified: Secondary | ICD-10-CM | POA: Diagnosis not present

## 2020-07-23 DIAGNOSIS — K2100 Gastro-esophageal reflux disease with esophagitis, without bleeding: Secondary | ICD-10-CM | POA: Diagnosis not present

## 2020-07-23 DIAGNOSIS — R1310 Dysphagia, unspecified: Secondary | ICD-10-CM | POA: Diagnosis not present

## 2020-07-23 DIAGNOSIS — M810 Age-related osteoporosis without current pathological fracture: Secondary | ICD-10-CM | POA: Diagnosis not present

## 2020-07-23 DIAGNOSIS — M329 Systemic lupus erythematosus, unspecified: Secondary | ICD-10-CM | POA: Diagnosis not present

## 2020-07-23 DIAGNOSIS — I10 Essential (primary) hypertension: Secondary | ICD-10-CM | POA: Diagnosis not present

## 2020-07-23 DIAGNOSIS — M546 Pain in thoracic spine: Secondary | ICD-10-CM | POA: Diagnosis not present

## 2020-07-23 DIAGNOSIS — J449 Chronic obstructive pulmonary disease, unspecified: Secondary | ICD-10-CM | POA: Diagnosis not present

## 2020-08-17 DIAGNOSIS — M79643 Pain in unspecified hand: Secondary | ICD-10-CM | POA: Diagnosis not present

## 2020-08-17 DIAGNOSIS — M359 Systemic involvement of connective tissue, unspecified: Secondary | ICD-10-CM | POA: Diagnosis not present

## 2020-08-17 DIAGNOSIS — M549 Dorsalgia, unspecified: Secondary | ICD-10-CM | POA: Diagnosis not present

## 2020-08-17 DIAGNOSIS — M1990 Unspecified osteoarthritis, unspecified site: Secondary | ICD-10-CM | POA: Diagnosis not present

## 2020-08-17 DIAGNOSIS — M2550 Pain in unspecified joint: Secondary | ICD-10-CM | POA: Diagnosis not present

## 2020-08-17 DIAGNOSIS — Z86718 Personal history of other venous thrombosis and embolism: Secondary | ICD-10-CM | POA: Diagnosis not present

## 2020-08-17 DIAGNOSIS — E559 Vitamin D deficiency, unspecified: Secondary | ICD-10-CM | POA: Diagnosis not present

## 2020-08-17 DIAGNOSIS — Z8739 Personal history of other diseases of the musculoskeletal system and connective tissue: Secondary | ICD-10-CM | POA: Diagnosis not present

## 2020-08-17 DIAGNOSIS — M810 Age-related osteoporosis without current pathological fracture: Secondary | ICD-10-CM | POA: Diagnosis not present

## 2020-09-28 DIAGNOSIS — M2041 Other hammer toe(s) (acquired), right foot: Secondary | ICD-10-CM | POA: Diagnosis not present

## 2020-09-28 DIAGNOSIS — D6851 Activated protein C resistance: Secondary | ICD-10-CM | POA: Diagnosis not present

## 2021-01-25 DIAGNOSIS — H25013 Cortical age-related cataract, bilateral: Secondary | ICD-10-CM | POA: Diagnosis not present

## 2021-01-25 DIAGNOSIS — H40013 Open angle with borderline findings, low risk, bilateral: Secondary | ICD-10-CM | POA: Diagnosis not present

## 2021-01-25 DIAGNOSIS — Z79899 Other long term (current) drug therapy: Secondary | ICD-10-CM | POA: Diagnosis not present

## 2021-01-25 DIAGNOSIS — H2513 Age-related nuclear cataract, bilateral: Secondary | ICD-10-CM | POA: Diagnosis not present

## 2021-02-22 DIAGNOSIS — E559 Vitamin D deficiency, unspecified: Secondary | ICD-10-CM | POA: Diagnosis not present

## 2021-02-22 DIAGNOSIS — M810 Age-related osteoporosis without current pathological fracture: Secondary | ICD-10-CM | POA: Diagnosis not present

## 2021-02-22 DIAGNOSIS — Z8739 Personal history of other diseases of the musculoskeletal system and connective tissue: Secondary | ICD-10-CM | POA: Diagnosis not present

## 2021-02-22 DIAGNOSIS — M1990 Unspecified osteoarthritis, unspecified site: Secondary | ICD-10-CM | POA: Diagnosis not present

## 2021-02-22 DIAGNOSIS — M359 Systemic involvement of connective tissue, unspecified: Secondary | ICD-10-CM | POA: Diagnosis not present

## 2021-02-22 DIAGNOSIS — M549 Dorsalgia, unspecified: Secondary | ICD-10-CM | POA: Diagnosis not present

## 2021-02-22 DIAGNOSIS — M79643 Pain in unspecified hand: Secondary | ICD-10-CM | POA: Diagnosis not present

## 2021-05-21 DIAGNOSIS — D6851 Activated protein C resistance: Secondary | ICD-10-CM | POA: Diagnosis not present

## 2021-05-21 DIAGNOSIS — Z23 Encounter for immunization: Secondary | ICD-10-CM | POA: Diagnosis not present

## 2021-05-21 DIAGNOSIS — M810 Age-related osteoporosis without current pathological fracture: Secondary | ICD-10-CM | POA: Diagnosis not present

## 2021-05-21 DIAGNOSIS — Z7901 Long term (current) use of anticoagulants: Secondary | ICD-10-CM | POA: Diagnosis not present

## 2021-05-21 DIAGNOSIS — I10 Essential (primary) hypertension: Secondary | ICD-10-CM | POA: Diagnosis not present

## 2021-05-21 DIAGNOSIS — E7800 Pure hypercholesterolemia, unspecified: Secondary | ICD-10-CM | POA: Diagnosis not present

## 2021-05-21 DIAGNOSIS — R7303 Prediabetes: Secondary | ICD-10-CM | POA: Diagnosis not present

## 2021-05-21 DIAGNOSIS — Z0000 Encounter for general adult medical examination without abnormal findings: Secondary | ICD-10-CM | POA: Diagnosis not present

## 2021-05-21 DIAGNOSIS — M329 Systemic lupus erythematosus, unspecified: Secondary | ICD-10-CM | POA: Diagnosis not present

## 2021-05-21 DIAGNOSIS — Z1231 Encounter for screening mammogram for malignant neoplasm of breast: Secondary | ICD-10-CM | POA: Diagnosis not present

## 2021-05-26 ENCOUNTER — Other Ambulatory Visit: Payer: Self-pay | Admitting: Family Medicine

## 2021-05-26 DIAGNOSIS — M810 Age-related osteoporosis without current pathological fracture: Secondary | ICD-10-CM

## 2021-05-26 DIAGNOSIS — Z1231 Encounter for screening mammogram for malignant neoplasm of breast: Secondary | ICD-10-CM

## 2021-07-19 ENCOUNTER — Ambulatory Visit: Payer: Medicare Other

## 2021-08-30 DIAGNOSIS — M549 Dorsalgia, unspecified: Secondary | ICD-10-CM | POA: Diagnosis not present

## 2021-08-30 DIAGNOSIS — E559 Vitamin D deficiency, unspecified: Secondary | ICD-10-CM | POA: Diagnosis not present

## 2021-08-30 DIAGNOSIS — M79643 Pain in unspecified hand: Secondary | ICD-10-CM | POA: Diagnosis not present

## 2021-08-30 DIAGNOSIS — M810 Age-related osteoporosis without current pathological fracture: Secondary | ICD-10-CM | POA: Diagnosis not present

## 2021-08-30 DIAGNOSIS — M359 Systemic involvement of connective tissue, unspecified: Secondary | ICD-10-CM | POA: Diagnosis not present

## 2021-08-30 DIAGNOSIS — I878 Other specified disorders of veins: Secondary | ICD-10-CM | POA: Diagnosis not present

## 2021-08-30 DIAGNOSIS — Z8739 Personal history of other diseases of the musculoskeletal system and connective tissue: Secondary | ICD-10-CM | POA: Diagnosis not present

## 2021-08-30 DIAGNOSIS — M1990 Unspecified osteoarthritis, unspecified site: Secondary | ICD-10-CM | POA: Diagnosis not present

## 2021-08-30 DIAGNOSIS — M7989 Other specified soft tissue disorders: Secondary | ICD-10-CM | POA: Diagnosis not present

## 2021-11-08 ENCOUNTER — Ambulatory Visit
Admission: RE | Admit: 2021-11-08 | Discharge: 2021-11-08 | Disposition: A | Discharge status: Home / Self Care | Payer: Medicare Other | Source: Ambulatory Visit | Attending: Family Medicine | Admitting: Family Medicine

## 2021-11-08 DIAGNOSIS — M8589 Other specified disorders of bone density and structure, multiple sites: Secondary | ICD-10-CM | POA: Diagnosis not present

## 2021-11-08 DIAGNOSIS — M810 Age-related osteoporosis without current pathological fracture: Secondary | ICD-10-CM

## 2021-11-08 DIAGNOSIS — Z780 Asymptomatic menopausal state: Secondary | ICD-10-CM | POA: Diagnosis not present

## 2021-11-19 DIAGNOSIS — M810 Age-related osteoporosis without current pathological fracture: Secondary | ICD-10-CM | POA: Diagnosis not present

## 2021-11-19 DIAGNOSIS — R7303 Prediabetes: Secondary | ICD-10-CM | POA: Diagnosis not present

## 2021-11-19 DIAGNOSIS — R001 Bradycardia, unspecified: Secondary | ICD-10-CM | POA: Diagnosis not present

## 2021-11-19 DIAGNOSIS — D6851 Activated protein C resistance: Secondary | ICD-10-CM | POA: Diagnosis not present

## 2021-11-19 DIAGNOSIS — I10 Essential (primary) hypertension: Secondary | ICD-10-CM | POA: Diagnosis not present

## 2021-11-29 ENCOUNTER — Telehealth: Payer: Self-pay

## 2021-11-29 NOTE — Telephone Encounter (Signed)
NOTES SCANNED TO REFERRAL 

## 2021-12-02 NOTE — Progress Notes (Signed)
Cardiology Office Note:   Date:  12/03/2021  NAME:  Jessica Orr    MRN: [id number] DOB:  13-Dec-1945   PCP:  Caren Macadam, MD  Cardiologist:  None  Electrophysiologist:  None   Referring MD: Caren Macadam, MD   Chief Complaint  Patient presents with   Bradycardia    History of Present Illness:   Jessica Orr is a 76 y.o. female with a hx of HTN, obesity, HLd who is being seen today for the evaluation of bradycardia at the request of Caren Macadam, MD. recently evaluated by her primary care physician.  Pulse noted to be in the 50s.  EKG today demonstrates sinus bradycardia heart rate 51.  She has no symptoms from this.  She can walk 1/4 mile daily without any issues.  No dizziness or lightheadedness.  She is not on a beta-blocker or AV nodal agents.  None of her medications appear to do this.  She was taken off lisinopril due to low blood pressure.  Blood pressure within limits today.  She has never had a heart attack or stroke but did smoke for 50 years.  She quit several years ago.  She does have lupus and mixed connective tissue disease.  She is prediabetic.  Most recent lipids total cholesterol 188, HDL 58, LDL 112, triglycerides 119.  She is not anemic.  No murmur on exam.  No recent ultrasound.  No chest pain.  No shortness of breath.  Overall she is without complaints.  Family history of heart disease in her father.  She has 1 son.  No grandchildren.  TSH 1.03 A1c 6.0  Problem List HTN HLD Obesity Factor V Deficiency  Lupus/MCTD  Past Medical History: Past Medical History:  Diagnosis Date   Cancer ([place])    uterine   COPD (chronic obstructive pulmonary disease) ([place])    DVT (deep venous thrombosis) ([place])    Esophageal stricture    Factor V deficiency ([place])    Hyperlipidemia    Hypertension    Polio 1950    Past Surgical History: Past Surgical History:  Procedure Laterality Date   ESOPHAGEAL MANOMETRY N/A 07/13/2020   Procedure: ESOPHAGEAL  MANOMETRY (EM);  Surgeon: Wilford Corner, MD;  Location: [facility];  Service: Gastroenterology;  Laterality: N/A;   nephrolithiasis      Current Medications: Current Meds  Medication Sig   acetaminophen (TYLENOL) 500 MG tablet Take 500 mg by mouth every 6 (six) hours as needed.   alendronate (FOSAMAX) 70 MG tablet Take 70 mg by mouth once a week.   amLODipine (NORVASC) 2.5 MG tablet    atorvastatin (LIPITOR) 10 MG tablet Take 10 mg by mouth daily. Take one at bedtime   hydroxychloroquine (PLAQUENIL) 200 MG tablet Take 200 mg by mouth 2 (two) times daily.   Multiple Vitamins-Minerals (VITAMIN D3 COMPLETE PO) Take by mouth. Taking 2000 U tablets - 2 tablets once a day   potassium chloride (K-DUR) 10 MEQ tablet Take 10 mEq by mouth daily.   XARELTO 10 MG TABS tablet Take 10 mg by mouth daily.     Allergies:    Ciprofloxacin   Social History: Social History   Socioeconomic History   Marital status: Widowed    Spouse name: Not on file   Number of children: 1   Years of education: Not on file   Highest education level: Not on file  Occupational History   Occupation: Retired Media planner  Tobacco Use   Smoking status: Former  Packs/day: 1.00    Years: 50.00    Pack years: 50.00    Types: Cigarettes    Quit date: 02/16/2016    Years since quitting: 5.8   Smokeless tobacco: Never  Substance and Sexual Activity   Alcohol use: No   Drug use: No   Sexual activity: Not on file  Other Topics Concern   Not on file  Social History Narrative   Not on file   Social Determinants of Health   Financial Resource Strain: Not on file  Food Insecurity: Not on file  Transportation Needs: Not on file  Physical Activity: Not on file  Stress: Not on file  Social Connections: Not on file     Family History: The patient's family history includes Alzheimer's disease in her father; Cancer in her maternal grandmother; Emphysema in her maternal grandfather; Heart attack in her father;  Hodgkin's lymphoma in her mother; Hypertension in her father; Leukemia in her paternal grandmother.  ROS:   All other ROS reviewed and negative. Pertinent positives noted in the HPI.     EKGs/Labs/Other Studies Reviewed:   The following studies were personally reviewed by me today:  EKG:  EKG is ordered today.  The ekg ordered today demonstrates SB 51 bpm, nonspecific STT changes, and was personally reviewed by me.   Recent Labs: No results found for requested labs within last 8760 hours.   Recent Lipid Panel No results found for: CHOL, TRIG, HDL, CHOLHDL, VLDL, LDLCALC, LDLDIRECT  Physical Exam:   VS:  BP 128/84   Pulse (!) 51   Ht 5' 0.5" (1.537 m)   Wt 174 lb (78.9 kg)   SpO2 99%   BMI 33.42 kg/m    Wt Readings from Last 3 Encounters:  12/03/21 174 lb (78.9 kg)  08/21/19 174 lb 14.4 oz (79.3 kg)  07/18/17 177 lb (80.3 kg)    General: Well nourished, well developed, in no acute distress Head: Atraumatic, normal size  Eyes: PEERLA, EOMI  Neck: Supple, no JVD Endocrine: No thryomegaly Cardiac: Normal S1, S2; RRR; no murmurs, rubs, or gallops Lungs: Clear to auscultation bilaterally, no wheezing, rhonchi or rales  Abd: Soft, nontender, no hepatomegaly  Ext: No edema, pulses 2+ Musculoskeletal: No deformities, BUE and BLE strength normal and equal Skin: Warm and dry, no rashes   Neuro: Alert and oriented to person, place, time, and situation, CNII-XII grossly intact, no focal deficits  Psych: Normal mood and affect   ASSESSMENT:   [name] is a 76 y.o. female who presents for the following: 1. Bradycardia     PLAN:   1. Bradycardia -Sinus bradycardia.  No symptoms.  TSH normal.  EKG demonstrates nonspecific ST-T changes.  She has no limitations.  She can walk 1/4 mile without chest pains or trouble breathing.  No dizziness or lightheadedness.  No murmurs on exam.  None of her medications can do this.  Blood pressure is better.  Would recommend she avoid AV  nodal agents.  We will obtain an echocardiogram just to make sure nothing is abnormal.  Given her lack of symptoms I believe we can monitor this.  She should look out for dizziness lightheadedness.  Also shortness of breath.  She has none of the symptoms.  We will plan to see her yearly.  Given her lack of symptoms I see no need for other testing other than an echocardiogram.  Disposition: Return in about 1 year (around 12/04/2022).  Medication Adjustments/Labs and Tests Ordered: Current medicines are  reviewed at length with the patient today.  Concerns regarding medicines are outlined above.  Orders Placed This Encounter  Procedures   EKG 12-Lead   ECHOCARDIOGRAM COMPLETE   No orders of the defined types were placed in this encounter.   Patient Instructions  Medication Instructions:  The current medical regimen is effective;  continue present plan and medications.  *If you need a refill on your cardiac medications before your next appointment, please call your pharmacy*   Testing/Procedures: Echocardiogram - Your physician has requested that you have an echocardiogram. Echocardiography is a painless test that uses sound waves to create images of your heart. It provides your doctor with information about the size and shape of your heart and how well your heart's chambers and valves are working. This procedure takes approximately one hour. There are no restrictions for this procedure.     Follow-Up: At [facility], you and your health needs are our priority.  As part of our continuing mission to provide you with exceptional heart care, we have created designated Provider Care Teams.  These Care Teams include your primary Cardiologist (physician) and Advanced Practice Providers (APPs -  Physician Assistants and Nurse Practitioners) who all work together to provide you with the care you need, when you need it.  We recommend signing up for the patient portal called "MyChart".  Sign up  information is provided on this After Visit Summary.  MyChart is used to connect with patients for Virtual Visits (Telemedicine).  Patients are able to view lab/test results, encounter notes, upcoming appointments, etc.  Non-urgent messages can be sent to your provider as well.   To learn more about what you can do with MyChart, go to [email or web address].    Your next appointment:   12 month(s)  The format for your next appointment:   In Person  Provider:   Eleonore Chiquito, MD or Sande Rives, PA-C, or Almyra Deforest, PA-C             Signed, Addison Naegeli. Audie Box, MD, [place]  [address], [place], [place] [address] [phone]  12/03/2021 8:53 AM

## 2021-12-03 ENCOUNTER — Encounter: Payer: Self-pay | Admitting: Cardiovascular Disease

## 2021-12-03 ENCOUNTER — Ambulatory Visit (INDEPENDENT_AMBULATORY_CARE_PROVIDER_SITE_OTHER): Payer: Medicare Other | Admitting: Cardiovascular Disease

## 2021-12-03 VITALS — BP 128/84 | HR 51 | Ht 60.5 in | Wt 174.0 lb

## 2021-12-03 DIAGNOSIS — R001 Bradycardia, unspecified: Secondary | ICD-10-CM

## 2021-12-03 NOTE — Patient Instructions (Signed)
Medication Instructions:  The current medical regimen is effective;  continue present plan and medications.  *If you need a refill on your cardiac medications before your next appointment, please call your pharmacy*   Testing/Procedures: Echocardiogram - Your physician has requested that you have an echocardiogram. Echocardiography is a painless test that uses sound waves to create images of your heart. It provides your doctor with information about the size and shape of your heart and how well your heart's chambers and valves are working. This procedure takes approximately one hour. There are no restrictions for this procedure.     Follow-Up: At [facility], you and your health needs are our priority.  As part of our continuing mission to provide you with exceptional heart care, we have created designated Provider Care Teams.  These Care Teams include your primary Cardiologist (physician) and Advanced Practice Providers (APPs -  Physician Assistants and Nurse Practitioners) who all work together to provide you with the care you need, when you need it.  We recommend signing up for the patient portal called "MyChart".  Sign up information is provided on this After Visit Summary.  MyChart is used to connect with patients for Virtual Visits (Telemedicine).  Patients are able to view lab/test results, encounter notes, upcoming appointments, etc.  Non-urgent messages can be sent to your provider as well.   To learn more about what you can do with MyChart, go to [email or web address].    Your next appointment:   12 month(s)  The format for your next appointment:   In Person  Provider:   Eleonore Chiquito, MD or Sande Rives, PA-C, or Almyra Deforest, [place]

## 2021-12-24 ENCOUNTER — Ambulatory Visit (HOSPITAL_COMMUNITY): Payer: Medicare Other | Attending: Cardiology

## 2021-12-24 DIAGNOSIS — R001 Bradycardia, unspecified: Secondary | ICD-10-CM | POA: Insufficient documentation

## 2021-12-24 LAB — ECHOCARDIOGRAM COMPLETE
Area-P 1/2: 3.97 cm2
S' Lateral: 2.3 cm

## 2021-12-25 IMAGING — CR DG THORACIC SPINE 3V
3 series · 3 of 3 positions shown · non-contrast
Comparison: 03/20/2019

CLINICAL DATA: Fall March 2020.  Osteoporosis.

EXAM:
THORACIC SPINE - 3 VIEWS

[w thoracic spine ap]
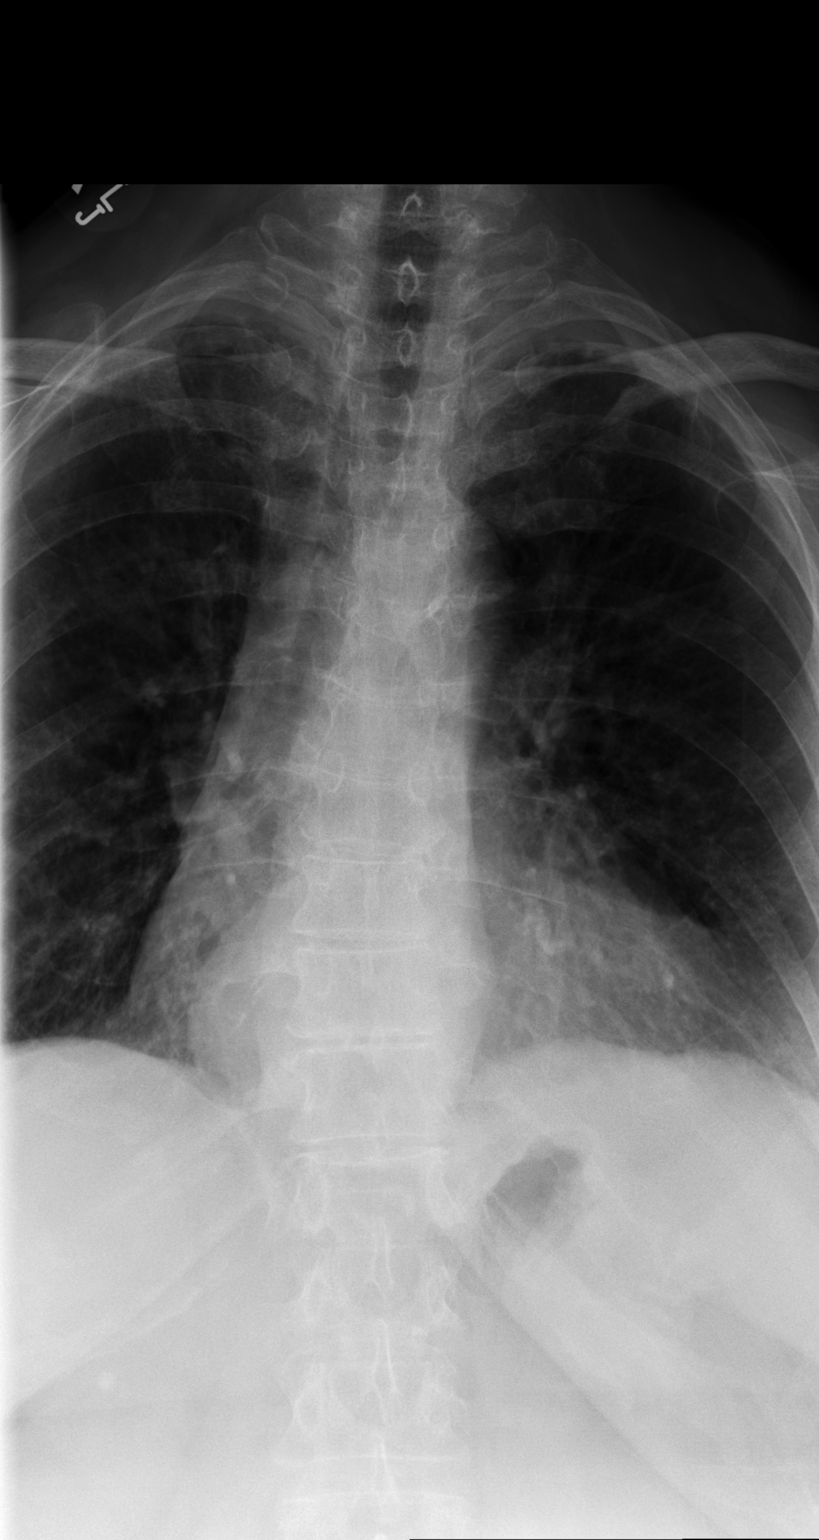

[w thoracic spine lat]
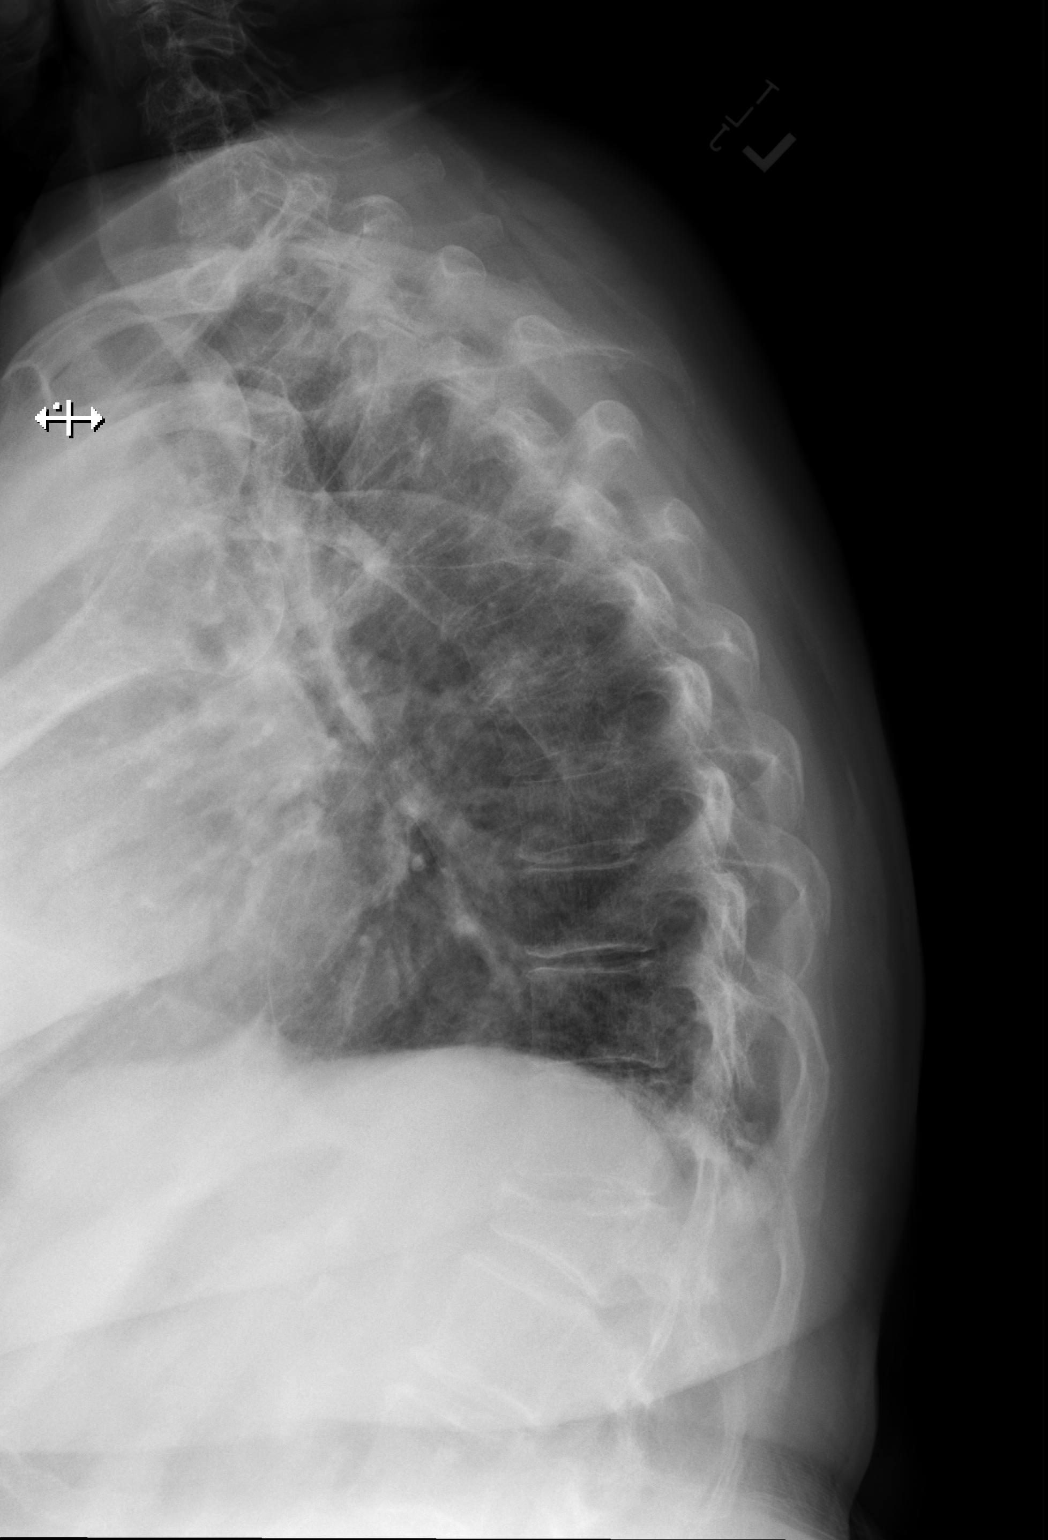

[w thoracic swimmers]
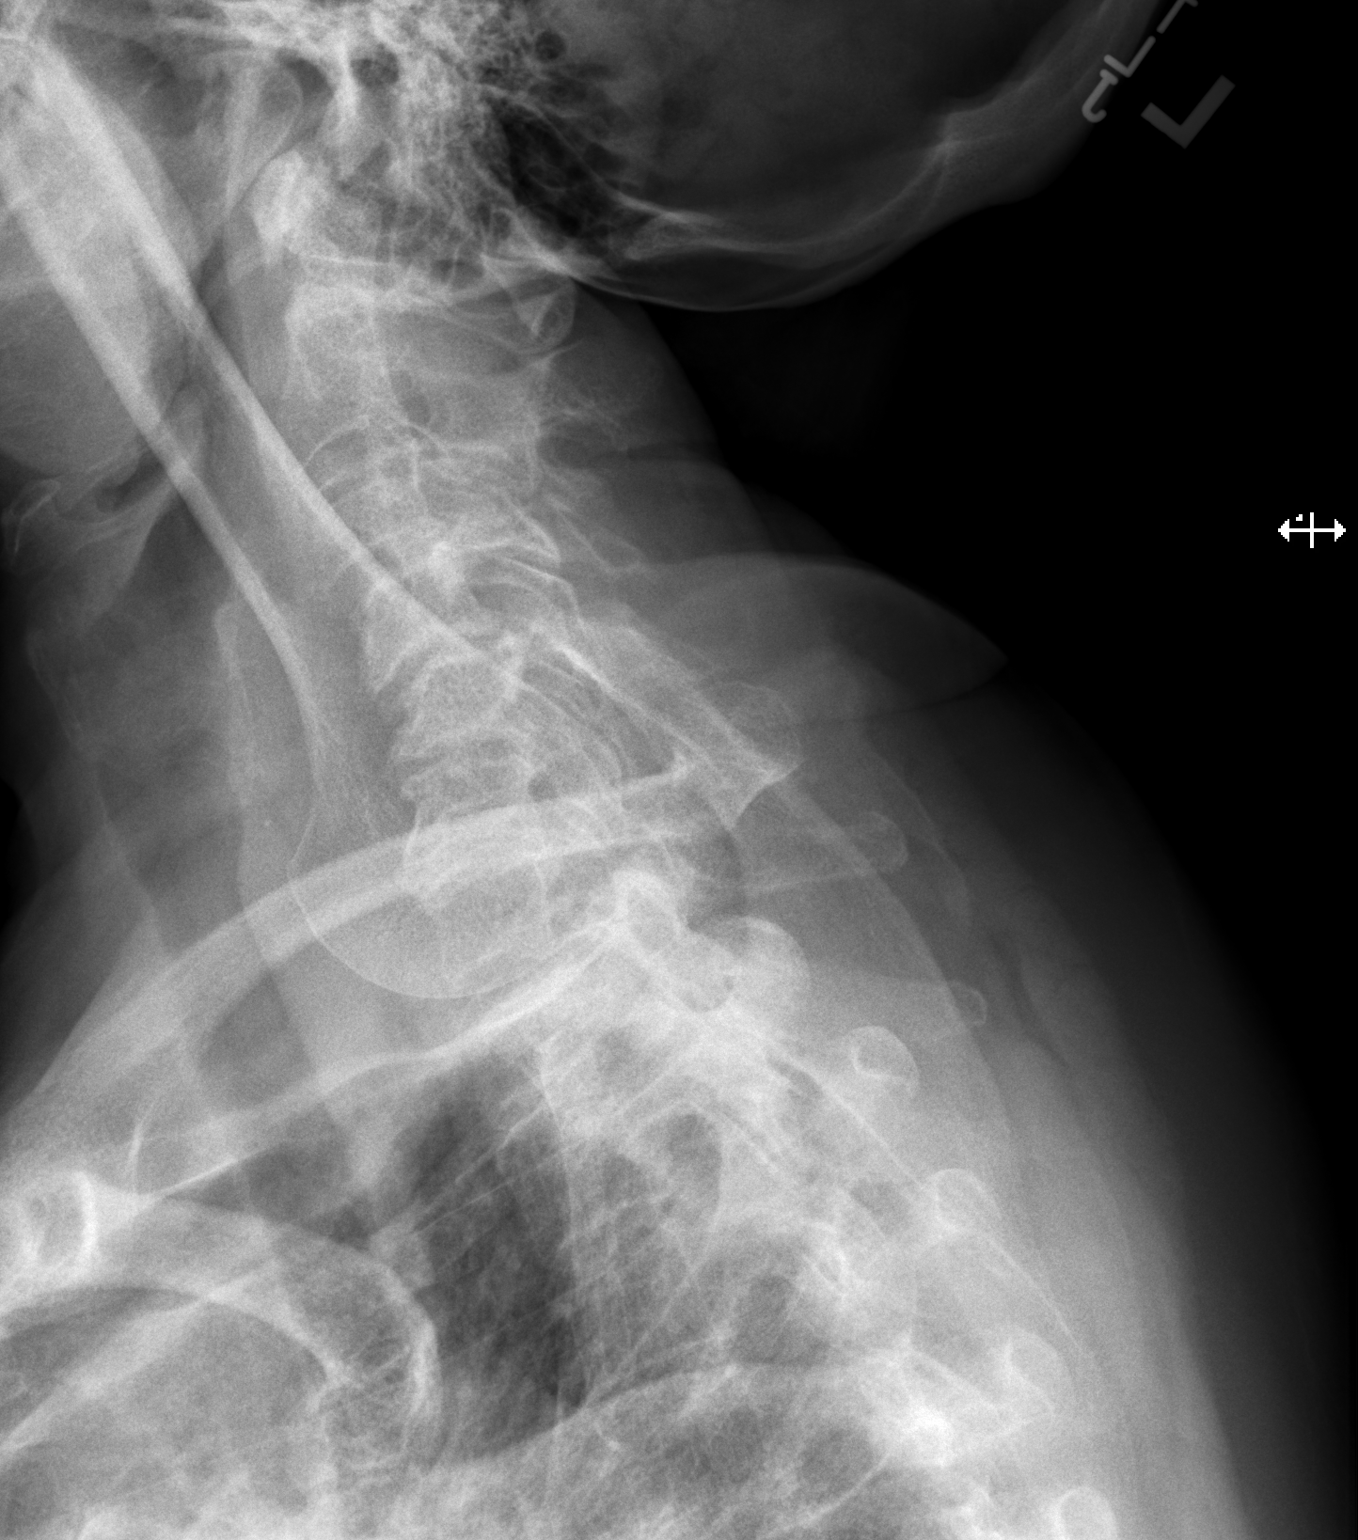

[3 of 3 positions shown; findings below may reference images not displayed]

FINDINGS: Vertebral body alignment is normal. There is a moderate compression
fracture involving the approximate T12 vertebral body new since the
previous exam but age indeterminate. Mild spondylosis of the
thoracic spine. Remainder of the exam is unchanged.
IMPRESSION: 1. Moderate compression fracture of the approximate T12 vertebral
body new since the previous exam, but age indeterminate.
2. Mild spondylosis of the thoracic spine.

## 2022-01-24 DIAGNOSIS — H25813 Combined forms of age-related cataract, bilateral: Secondary | ICD-10-CM | POA: Diagnosis not present

## 2022-01-24 DIAGNOSIS — H40013 Open angle with borderline findings, low risk, bilateral: Secondary | ICD-10-CM | POA: Diagnosis not present

## 2022-01-24 DIAGNOSIS — Z79899 Other long term (current) drug therapy: Secondary | ICD-10-CM | POA: Diagnosis not present

## 2022-02-18 DIAGNOSIS — E785 Hyperlipidemia, unspecified: Secondary | ICD-10-CM | POA: Diagnosis not present

## 2022-02-18 DIAGNOSIS — D6851 Activated protein C resistance: Secondary | ICD-10-CM | POA: Diagnosis not present

## 2022-02-18 DIAGNOSIS — Z23 Encounter for immunization: Secondary | ICD-10-CM | POA: Diagnosis not present

## 2022-02-18 DIAGNOSIS — R7303 Prediabetes: Secondary | ICD-10-CM | POA: Diagnosis not present

## 2022-02-18 DIAGNOSIS — M329 Systemic lupus erythematosus, unspecified: Secondary | ICD-10-CM | POA: Diagnosis not present

## 2022-02-18 DIAGNOSIS — I10 Essential (primary) hypertension: Secondary | ICD-10-CM | POA: Diagnosis not present

## 2022-02-28 DIAGNOSIS — E559 Vitamin D deficiency, unspecified: Secondary | ICD-10-CM | POA: Diagnosis not present

## 2022-02-28 DIAGNOSIS — M7989 Other specified soft tissue disorders: Secondary | ICD-10-CM | POA: Diagnosis not present

## 2022-02-28 DIAGNOSIS — M1990 Unspecified osteoarthritis, unspecified site: Secondary | ICD-10-CM | POA: Diagnosis not present

## 2022-02-28 DIAGNOSIS — Z8739 Personal history of other diseases of the musculoskeletal system and connective tissue: Secondary | ICD-10-CM | POA: Diagnosis not present

## 2022-02-28 DIAGNOSIS — M810 Age-related osteoporosis without current pathological fracture: Secondary | ICD-10-CM | POA: Diagnosis not present

## 2022-02-28 DIAGNOSIS — I878 Other specified disorders of veins: Secondary | ICD-10-CM | POA: Diagnosis not present

## 2022-02-28 DIAGNOSIS — M549 Dorsalgia, unspecified: Secondary | ICD-10-CM | POA: Diagnosis not present

## 2022-02-28 DIAGNOSIS — M79643 Pain in unspecified hand: Secondary | ICD-10-CM | POA: Diagnosis not present

## 2022-02-28 DIAGNOSIS — M359 Systemic involvement of connective tissue, unspecified: Secondary | ICD-10-CM | POA: Diagnosis not present

## 2022-05-27 DIAGNOSIS — M810 Age-related osteoporosis without current pathological fracture: Secondary | ICD-10-CM | POA: Diagnosis not present

## 2022-05-27 DIAGNOSIS — M25561 Pain in right knee: Secondary | ICD-10-CM | POA: Diagnosis not present

## 2022-05-27 DIAGNOSIS — Z0000 Encounter for general adult medical examination without abnormal findings: Secondary | ICD-10-CM | POA: Diagnosis not present

## 2022-05-27 DIAGNOSIS — Z79899 Other long term (current) drug therapy: Secondary | ICD-10-CM | POA: Diagnosis not present

## 2022-05-27 DIAGNOSIS — Z23 Encounter for immunization: Secondary | ICD-10-CM | POA: Diagnosis not present

## 2022-05-27 DIAGNOSIS — M329 Systemic lupus erythematosus, unspecified: Secondary | ICD-10-CM | POA: Diagnosis not present

## 2022-05-27 DIAGNOSIS — E785 Hyperlipidemia, unspecified: Secondary | ICD-10-CM | POA: Diagnosis not present

## 2022-05-27 DIAGNOSIS — I82403 Acute embolism and thrombosis of unspecified deep veins of lower extremity, bilateral: Secondary | ICD-10-CM | POA: Diagnosis not present

## 2022-05-27 DIAGNOSIS — D6851 Activated protein C resistance: Secondary | ICD-10-CM | POA: Diagnosis not present

## 2022-05-27 DIAGNOSIS — Z87891 Personal history of nicotine dependence: Secondary | ICD-10-CM | POA: Diagnosis not present

## 2022-05-27 DIAGNOSIS — R7303 Prediabetes: Secondary | ICD-10-CM | POA: Diagnosis not present

## 2022-05-27 DIAGNOSIS — Z122 Encounter for screening for malignant neoplasm of respiratory organs: Secondary | ICD-10-CM | POA: Diagnosis not present

## 2022-05-30 ENCOUNTER — Other Ambulatory Visit: Payer: Self-pay | Admitting: Family Medicine

## 2022-05-30 DIAGNOSIS — Z122 Encounter for screening for malignant neoplasm of respiratory organs: Secondary | ICD-10-CM

## 2022-06-13 DIAGNOSIS — M25561 Pain in right knee: Secondary | ICD-10-CM | POA: Diagnosis not present

## 2022-06-13 DIAGNOSIS — M1711 Unilateral primary osteoarthritis, right knee: Secondary | ICD-10-CM | POA: Diagnosis not present

## 2022-07-18 ENCOUNTER — Ambulatory Visit
Admission: RE | Admit: 2022-07-18 | Discharge: 2022-07-18 | Disposition: A | Discharge status: Home / Self Care | Payer: Medicare Other | Source: Ambulatory Visit | Attending: Family Medicine | Admitting: Family Medicine

## 2022-07-18 DIAGNOSIS — I2510 Atherosclerotic heart disease of native coronary artery without angina pectoris: Secondary | ICD-10-CM | POA: Diagnosis not present

## 2022-07-18 DIAGNOSIS — Z122 Encounter for screening for malignant neoplasm of respiratory organs: Secondary | ICD-10-CM

## 2022-07-18 DIAGNOSIS — Z87891 Personal history of nicotine dependence: Secondary | ICD-10-CM | POA: Diagnosis not present

## 2022-07-18 DIAGNOSIS — J432 Centrilobular emphysema: Secondary | ICD-10-CM | POA: Diagnosis not present

## 2022-07-18 DIAGNOSIS — I700 Atherosclerosis of aorta: Secondary | ICD-10-CM | POA: Diagnosis not present

## 2022-07-20 ENCOUNTER — Other Ambulatory Visit: Payer: Self-pay | Admitting: Family Medicine

## 2022-07-20 ENCOUNTER — Encounter: Payer: Self-pay | Admitting: Family Medicine

## 2022-07-20 DIAGNOSIS — K769 Liver disease, unspecified: Secondary | ICD-10-CM

## 2022-07-25 DIAGNOSIS — M25561 Pain in right knee: Secondary | ICD-10-CM | POA: Diagnosis not present

## 2022-07-25 DIAGNOSIS — M1711 Unilateral primary osteoarthritis, right knee: Secondary | ICD-10-CM | POA: Diagnosis not present

## 2022-08-01 DIAGNOSIS — Z8542 Personal history of malignant neoplasm of other parts of uterus: Secondary | ICD-10-CM | POA: Diagnosis not present

## 2022-08-01 DIAGNOSIS — M329 Systemic lupus erythematosus, unspecified: Secondary | ICD-10-CM | POA: Diagnosis not present

## 2022-08-01 DIAGNOSIS — J449 Chronic obstructive pulmonary disease, unspecified: Secondary | ICD-10-CM | POA: Diagnosis not present

## 2022-08-01 DIAGNOSIS — Z86711 Personal history of pulmonary embolism: Secondary | ICD-10-CM | POA: Diagnosis not present

## 2022-08-01 DIAGNOSIS — Z9181 History of falling: Secondary | ICD-10-CM | POA: Diagnosis not present

## 2022-08-01 DIAGNOSIS — E785 Hyperlipidemia, unspecified: Secondary | ICD-10-CM | POA: Diagnosis not present

## 2022-08-01 DIAGNOSIS — Z87891 Personal history of nicotine dependence: Secondary | ICD-10-CM | POA: Diagnosis not present

## 2022-08-01 DIAGNOSIS — Z7901 Long term (current) use of anticoagulants: Secondary | ICD-10-CM | POA: Diagnosis not present

## 2022-08-01 DIAGNOSIS — Z86718 Personal history of other venous thrombosis and embolism: Secondary | ICD-10-CM | POA: Diagnosis not present

## 2022-08-01 DIAGNOSIS — M1711 Unilateral primary osteoarthritis, right knee: Secondary | ICD-10-CM | POA: Diagnosis not present

## 2022-08-01 DIAGNOSIS — I10 Essential (primary) hypertension: Secondary | ICD-10-CM | POA: Diagnosis not present

## 2022-08-01 DIAGNOSIS — M810 Age-related osteoporosis without current pathological fracture: Secondary | ICD-10-CM | POA: Diagnosis not present

## 2022-08-03 DIAGNOSIS — M1711 Unilateral primary osteoarthritis, right knee: Secondary | ICD-10-CM | POA: Diagnosis not present

## 2022-08-03 DIAGNOSIS — E785 Hyperlipidemia, unspecified: Secondary | ICD-10-CM | POA: Diagnosis not present

## 2022-08-03 DIAGNOSIS — J449 Chronic obstructive pulmonary disease, unspecified: Secondary | ICD-10-CM | POA: Diagnosis not present

## 2022-08-03 DIAGNOSIS — M329 Systemic lupus erythematosus, unspecified: Secondary | ICD-10-CM | POA: Diagnosis not present

## 2022-08-03 DIAGNOSIS — M810 Age-related osteoporosis without current pathological fracture: Secondary | ICD-10-CM | POA: Diagnosis not present

## 2022-08-03 DIAGNOSIS — I10 Essential (primary) hypertension: Secondary | ICD-10-CM | POA: Diagnosis not present

## 2022-08-08 ENCOUNTER — Ambulatory Visit
Admission: RE | Admit: 2022-08-08 | Discharge: 2022-08-08 | Disposition: A | Discharge status: Home / Self Care | Payer: Medicare Other | Source: Ambulatory Visit | Attending: Family Medicine | Admitting: Family Medicine

## 2022-08-08 DIAGNOSIS — K769 Liver disease, unspecified: Secondary | ICD-10-CM

## 2022-08-08 MED ORDER — GADOPICLENOL 0.5 MMOL/ML IV SOLN
7.5000 mL | Freq: Once | INTRAVENOUS | Status: AC | PRN
Start: 2022-08-08 — End: 2022-08-08
  Administered 2022-08-08: 7.5 mL via INTRAVENOUS

## 2022-08-09 DIAGNOSIS — E785 Hyperlipidemia, unspecified: Secondary | ICD-10-CM | POA: Diagnosis not present

## 2022-08-09 DIAGNOSIS — I10 Essential (primary) hypertension: Secondary | ICD-10-CM | POA: Diagnosis not present

## 2022-08-09 DIAGNOSIS — M329 Systemic lupus erythematosus, unspecified: Secondary | ICD-10-CM | POA: Diagnosis not present

## 2022-08-09 DIAGNOSIS — M810 Age-related osteoporosis without current pathological fracture: Secondary | ICD-10-CM | POA: Diagnosis not present

## 2022-08-09 DIAGNOSIS — J449 Chronic obstructive pulmonary disease, unspecified: Secondary | ICD-10-CM | POA: Diagnosis not present

## 2022-08-09 DIAGNOSIS — M1711 Unilateral primary osteoarthritis, right knee: Secondary | ICD-10-CM | POA: Diagnosis not present

## 2022-08-11 DIAGNOSIS — M1711 Unilateral primary osteoarthritis, right knee: Secondary | ICD-10-CM | POA: Diagnosis not present

## 2022-08-11 DIAGNOSIS — E785 Hyperlipidemia, unspecified: Secondary | ICD-10-CM | POA: Diagnosis not present

## 2022-08-11 DIAGNOSIS — M810 Age-related osteoporosis without current pathological fracture: Secondary | ICD-10-CM | POA: Diagnosis not present

## 2022-08-11 DIAGNOSIS — M329 Systemic lupus erythematosus, unspecified: Secondary | ICD-10-CM | POA: Diagnosis not present

## 2022-08-11 DIAGNOSIS — J449 Chronic obstructive pulmonary disease, unspecified: Secondary | ICD-10-CM | POA: Diagnosis not present

## 2022-08-11 DIAGNOSIS — I10 Essential (primary) hypertension: Secondary | ICD-10-CM | POA: Diagnosis not present

## 2022-08-12 DIAGNOSIS — R42 Dizziness and giddiness: Secondary | ICD-10-CM | POA: Diagnosis not present

## 2022-08-15 DIAGNOSIS — J449 Chronic obstructive pulmonary disease, unspecified: Secondary | ICD-10-CM | POA: Diagnosis not present

## 2022-08-15 DIAGNOSIS — M810 Age-related osteoporosis without current pathological fracture: Secondary | ICD-10-CM | POA: Diagnosis not present

## 2022-08-15 DIAGNOSIS — I10 Essential (primary) hypertension: Secondary | ICD-10-CM | POA: Diagnosis not present

## 2022-08-15 DIAGNOSIS — M1711 Unilateral primary osteoarthritis, right knee: Secondary | ICD-10-CM | POA: Diagnosis not present

## 2022-08-15 DIAGNOSIS — E785 Hyperlipidemia, unspecified: Secondary | ICD-10-CM | POA: Diagnosis not present

## 2022-08-15 DIAGNOSIS — M329 Systemic lupus erythematosus, unspecified: Secondary | ICD-10-CM | POA: Diagnosis not present

## 2022-08-18 DIAGNOSIS — J449 Chronic obstructive pulmonary disease, unspecified: Secondary | ICD-10-CM | POA: Diagnosis not present

## 2022-08-18 DIAGNOSIS — M1711 Unilateral primary osteoarthritis, right knee: Secondary | ICD-10-CM | POA: Diagnosis not present

## 2022-08-18 DIAGNOSIS — M329 Systemic lupus erythematosus, unspecified: Secondary | ICD-10-CM | POA: Diagnosis not present

## 2022-08-18 DIAGNOSIS — I10 Essential (primary) hypertension: Secondary | ICD-10-CM | POA: Diagnosis not present

## 2022-08-18 DIAGNOSIS — E785 Hyperlipidemia, unspecified: Secondary | ICD-10-CM | POA: Diagnosis not present

## 2022-08-18 DIAGNOSIS — M810 Age-related osteoporosis without current pathological fracture: Secondary | ICD-10-CM | POA: Diagnosis not present

## 2022-08-22 DIAGNOSIS — E785 Hyperlipidemia, unspecified: Secondary | ICD-10-CM | POA: Diagnosis not present

## 2022-08-22 DIAGNOSIS — M1711 Unilateral primary osteoarthritis, right knee: Secondary | ICD-10-CM | POA: Diagnosis not present

## 2022-08-22 DIAGNOSIS — J449 Chronic obstructive pulmonary disease, unspecified: Secondary | ICD-10-CM | POA: Diagnosis not present

## 2022-08-22 DIAGNOSIS — M329 Systemic lupus erythematosus, unspecified: Secondary | ICD-10-CM | POA: Diagnosis not present

## 2022-08-22 DIAGNOSIS — M810 Age-related osteoporosis without current pathological fracture: Secondary | ICD-10-CM | POA: Diagnosis not present

## 2022-08-22 DIAGNOSIS — I10 Essential (primary) hypertension: Secondary | ICD-10-CM | POA: Diagnosis not present

## 2022-08-25 DIAGNOSIS — I10 Essential (primary) hypertension: Secondary | ICD-10-CM | POA: Diagnosis not present

## 2022-08-25 DIAGNOSIS — M1711 Unilateral primary osteoarthritis, right knee: Secondary | ICD-10-CM | POA: Diagnosis not present

## 2022-08-25 DIAGNOSIS — J449 Chronic obstructive pulmonary disease, unspecified: Secondary | ICD-10-CM | POA: Diagnosis not present

## 2022-08-25 DIAGNOSIS — M329 Systemic lupus erythematosus, unspecified: Secondary | ICD-10-CM | POA: Diagnosis not present

## 2022-08-25 DIAGNOSIS — E785 Hyperlipidemia, unspecified: Secondary | ICD-10-CM | POA: Diagnosis not present

## 2022-08-25 DIAGNOSIS — M810 Age-related osteoporosis without current pathological fracture: Secondary | ICD-10-CM | POA: Diagnosis not present

## 2022-08-29 DIAGNOSIS — M1990 Unspecified osteoarthritis, unspecified site: Secondary | ICD-10-CM | POA: Diagnosis not present

## 2022-08-29 DIAGNOSIS — M359 Systemic involvement of connective tissue, unspecified: Secondary | ICD-10-CM | POA: Diagnosis not present

## 2022-08-29 DIAGNOSIS — I878 Other specified disorders of veins: Secondary | ICD-10-CM | POA: Diagnosis not present

## 2022-08-29 DIAGNOSIS — Z8739 Personal history of other diseases of the musculoskeletal system and connective tissue: Secondary | ICD-10-CM | POA: Diagnosis not present

## 2022-08-29 DIAGNOSIS — M549 Dorsalgia, unspecified: Secondary | ICD-10-CM | POA: Diagnosis not present

## 2022-08-29 DIAGNOSIS — M810 Age-related osteoporosis without current pathological fracture: Secondary | ICD-10-CM | POA: Diagnosis not present

## 2022-08-29 DIAGNOSIS — Z79899 Other long term (current) drug therapy: Secondary | ICD-10-CM | POA: Diagnosis not present

## 2022-08-29 DIAGNOSIS — E559 Vitamin D deficiency, unspecified: Secondary | ICD-10-CM | POA: Diagnosis not present

## 2022-08-29 DIAGNOSIS — M7989 Other specified soft tissue disorders: Secondary | ICD-10-CM | POA: Diagnosis not present

## 2022-08-29 DIAGNOSIS — M79643 Pain in unspecified hand: Secondary | ICD-10-CM | POA: Diagnosis not present

## 2022-08-31 DIAGNOSIS — Z8542 Personal history of malignant neoplasm of other parts of uterus: Secondary | ICD-10-CM | POA: Diagnosis not present

## 2022-08-31 DIAGNOSIS — Z87891 Personal history of nicotine dependence: Secondary | ICD-10-CM | POA: Diagnosis not present

## 2022-08-31 DIAGNOSIS — J449 Chronic obstructive pulmonary disease, unspecified: Secondary | ICD-10-CM | POA: Diagnosis not present

## 2022-08-31 DIAGNOSIS — M1711 Unilateral primary osteoarthritis, right knee: Secondary | ICD-10-CM | POA: Diagnosis not present

## 2022-08-31 DIAGNOSIS — Z7901 Long term (current) use of anticoagulants: Secondary | ICD-10-CM | POA: Diagnosis not present

## 2022-08-31 DIAGNOSIS — M810 Age-related osteoporosis without current pathological fracture: Secondary | ICD-10-CM | POA: Diagnosis not present

## 2022-08-31 DIAGNOSIS — E785 Hyperlipidemia, unspecified: Secondary | ICD-10-CM | POA: Diagnosis not present

## 2022-08-31 DIAGNOSIS — M329 Systemic lupus erythematosus, unspecified: Secondary | ICD-10-CM | POA: Diagnosis not present

## 2022-08-31 DIAGNOSIS — I10 Essential (primary) hypertension: Secondary | ICD-10-CM | POA: Diagnosis not present

## 2022-08-31 DIAGNOSIS — Z86718 Personal history of other venous thrombosis and embolism: Secondary | ICD-10-CM | POA: Diagnosis not present

## 2022-08-31 DIAGNOSIS — Z86711 Personal history of pulmonary embolism: Secondary | ICD-10-CM | POA: Diagnosis not present

## 2022-08-31 DIAGNOSIS — Z9181 History of falling: Secondary | ICD-10-CM | POA: Diagnosis not present

## 2022-09-01 DIAGNOSIS — I10 Essential (primary) hypertension: Secondary | ICD-10-CM | POA: Diagnosis not present

## 2022-09-01 DIAGNOSIS — J449 Chronic obstructive pulmonary disease, unspecified: Secondary | ICD-10-CM | POA: Diagnosis not present

## 2022-09-01 DIAGNOSIS — E785 Hyperlipidemia, unspecified: Secondary | ICD-10-CM | POA: Diagnosis not present

## 2022-09-01 DIAGNOSIS — M1711 Unilateral primary osteoarthritis, right knee: Secondary | ICD-10-CM | POA: Diagnosis not present

## 2022-09-01 DIAGNOSIS — M810 Age-related osteoporosis without current pathological fracture: Secondary | ICD-10-CM | POA: Diagnosis not present

## 2022-09-01 DIAGNOSIS — M329 Systemic lupus erythematosus, unspecified: Secondary | ICD-10-CM | POA: Diagnosis not present

## 2022-09-07 DIAGNOSIS — M1711 Unilateral primary osteoarthritis, right knee: Secondary | ICD-10-CM | POA: Diagnosis not present

## 2022-09-07 DIAGNOSIS — I10 Essential (primary) hypertension: Secondary | ICD-10-CM | POA: Diagnosis not present

## 2022-09-07 DIAGNOSIS — J449 Chronic obstructive pulmonary disease, unspecified: Secondary | ICD-10-CM | POA: Diagnosis not present

## 2022-09-07 DIAGNOSIS — M329 Systemic lupus erythematosus, unspecified: Secondary | ICD-10-CM | POA: Diagnosis not present

## 2022-09-07 DIAGNOSIS — M810 Age-related osteoporosis without current pathological fracture: Secondary | ICD-10-CM | POA: Diagnosis not present

## 2022-09-07 DIAGNOSIS — E785 Hyperlipidemia, unspecified: Secondary | ICD-10-CM | POA: Diagnosis not present

## 2022-09-12 DIAGNOSIS — M1711 Unilateral primary osteoarthritis, right knee: Secondary | ICD-10-CM | POA: Diagnosis not present

## 2022-09-12 DIAGNOSIS — M810 Age-related osteoporosis without current pathological fracture: Secondary | ICD-10-CM | POA: Diagnosis not present

## 2022-09-12 DIAGNOSIS — E785 Hyperlipidemia, unspecified: Secondary | ICD-10-CM | POA: Diagnosis not present

## 2022-09-12 DIAGNOSIS — M329 Systemic lupus erythematosus, unspecified: Secondary | ICD-10-CM | POA: Diagnosis not present

## 2022-09-12 DIAGNOSIS — I10 Essential (primary) hypertension: Secondary | ICD-10-CM | POA: Diagnosis not present

## 2022-09-12 DIAGNOSIS — J449 Chronic obstructive pulmonary disease, unspecified: Secondary | ICD-10-CM | POA: Diagnosis not present

## 2022-09-21 DIAGNOSIS — E785 Hyperlipidemia, unspecified: Secondary | ICD-10-CM | POA: Diagnosis not present

## 2022-09-21 DIAGNOSIS — J449 Chronic obstructive pulmonary disease, unspecified: Secondary | ICD-10-CM | POA: Diagnosis not present

## 2022-09-21 DIAGNOSIS — M329 Systemic lupus erythematosus, unspecified: Secondary | ICD-10-CM | POA: Diagnosis not present

## 2022-09-21 DIAGNOSIS — I10 Essential (primary) hypertension: Secondary | ICD-10-CM | POA: Diagnosis not present

## 2022-09-21 DIAGNOSIS — M1711 Unilateral primary osteoarthritis, right knee: Secondary | ICD-10-CM | POA: Diagnosis not present

## 2022-09-21 DIAGNOSIS — M810 Age-related osteoporosis without current pathological fracture: Secondary | ICD-10-CM | POA: Diagnosis not present

## 2022-09-27 DIAGNOSIS — M810 Age-related osteoporosis without current pathological fracture: Secondary | ICD-10-CM | POA: Diagnosis not present

## 2022-09-27 DIAGNOSIS — J449 Chronic obstructive pulmonary disease, unspecified: Secondary | ICD-10-CM | POA: Diagnosis not present

## 2022-09-27 DIAGNOSIS — M1711 Unilateral primary osteoarthritis, right knee: Secondary | ICD-10-CM | POA: Diagnosis not present

## 2022-09-27 DIAGNOSIS — M329 Systemic lupus erythematosus, unspecified: Secondary | ICD-10-CM | POA: Diagnosis not present

## 2022-09-27 DIAGNOSIS — I10 Essential (primary) hypertension: Secondary | ICD-10-CM | POA: Diagnosis not present

## 2022-09-27 DIAGNOSIS — E785 Hyperlipidemia, unspecified: Secondary | ICD-10-CM | POA: Diagnosis not present

## 2023-03-14 DIAGNOSIS — I10 Essential (primary) hypertension: Secondary | ICD-10-CM | POA: Diagnosis not present

## 2023-03-14 DIAGNOSIS — M329 Systemic lupus erythematosus, unspecified: Secondary | ICD-10-CM | POA: Diagnosis not present

## 2023-03-14 DIAGNOSIS — M810 Age-related osteoporosis without current pathological fracture: Secondary | ICD-10-CM | POA: Diagnosis not present

## 2023-03-14 DIAGNOSIS — M25561 Pain in right knee: Secondary | ICD-10-CM | POA: Diagnosis not present

## 2023-03-14 DIAGNOSIS — Z79899 Other long term (current) drug therapy: Secondary | ICD-10-CM | POA: Diagnosis not present

## 2023-03-14 DIAGNOSIS — D6851 Activated protein C resistance: Secondary | ICD-10-CM | POA: Diagnosis not present

## 2023-03-14 DIAGNOSIS — R7303 Prediabetes: Secondary | ICD-10-CM | POA: Diagnosis not present

## 2023-03-14 DIAGNOSIS — E785 Hyperlipidemia, unspecified: Secondary | ICD-10-CM | POA: Diagnosis not present

## 2023-03-27 DIAGNOSIS — H40013 Open angle with borderline findings, low risk, bilateral: Secondary | ICD-10-CM | POA: Diagnosis not present

## 2023-03-27 DIAGNOSIS — H524 Presbyopia: Secondary | ICD-10-CM | POA: Diagnosis not present

## 2023-03-27 DIAGNOSIS — Z79899 Other long term (current) drug therapy: Secondary | ICD-10-CM | POA: Diagnosis not present

## 2023-03-27 DIAGNOSIS — H25813 Combined forms of age-related cataract, bilateral: Secondary | ICD-10-CM | POA: Diagnosis not present

## 2023-03-30 DIAGNOSIS — M1711 Unilateral primary osteoarthritis, right knee: Secondary | ICD-10-CM | POA: Diagnosis not present

## 2023-03-30 DIAGNOSIS — M25561 Pain in right knee: Secondary | ICD-10-CM | POA: Diagnosis not present

## 2023-03-30 DIAGNOSIS — G8929 Other chronic pain: Secondary | ICD-10-CM | POA: Diagnosis not present

## 2023-03-30 DIAGNOSIS — M25461 Effusion, right knee: Secondary | ICD-10-CM | POA: Diagnosis not present

## 2023-04-10 DIAGNOSIS — Z79899 Other long term (current) drug therapy: Secondary | ICD-10-CM | POA: Diagnosis not present

## 2023-04-10 DIAGNOSIS — Z8739 Personal history of other diseases of the musculoskeletal system and connective tissue: Secondary | ICD-10-CM | POA: Diagnosis not present

## 2023-04-10 DIAGNOSIS — M1990 Unspecified osteoarthritis, unspecified site: Secondary | ICD-10-CM | POA: Diagnosis not present

## 2023-04-10 DIAGNOSIS — M359 Systemic involvement of connective tissue, unspecified: Secondary | ICD-10-CM | POA: Diagnosis not present

## 2023-04-10 DIAGNOSIS — E559 Vitamin D deficiency, unspecified: Secondary | ICD-10-CM | POA: Diagnosis not present

## 2023-04-10 DIAGNOSIS — I878 Other specified disorders of veins: Secondary | ICD-10-CM | POA: Diagnosis not present

## 2023-04-10 DIAGNOSIS — M79643 Pain in unspecified hand: Secondary | ICD-10-CM | POA: Diagnosis not present

## 2023-04-10 DIAGNOSIS — M810 Age-related osteoporosis without current pathological fracture: Secondary | ICD-10-CM | POA: Diagnosis not present

## 2023-04-10 DIAGNOSIS — M549 Dorsalgia, unspecified: Secondary | ICD-10-CM | POA: Diagnosis not present

## 2023-04-10 DIAGNOSIS — M7989 Other specified soft tissue disorders: Secondary | ICD-10-CM | POA: Diagnosis not present

## 2023-05-11 DIAGNOSIS — M25561 Pain in right knee: Secondary | ICD-10-CM | POA: Diagnosis not present

## 2023-06-04 ENCOUNTER — Emergency Department (HOSPITAL_COMMUNITY): Payer: Medicare Other

## 2023-06-04 ENCOUNTER — Inpatient Hospital Stay (HOSPITAL_COMMUNITY): Payer: Medicare Other

## 2023-06-04 ENCOUNTER — Inpatient Hospital Stay (HOSPITAL_COMMUNITY)
Admission: EM | Admit: 2023-06-04 | Discharge: 2023-06-08 | DRG: 190 | Disposition: A | Discharge status: Home Health | Payer: Medicare Other | Attending: Internal Medicine | Admitting: Internal Medicine

## 2023-06-04 ENCOUNTER — Encounter (HOSPITAL_COMMUNITY): Payer: Self-pay

## 2023-06-04 ENCOUNTER — Other Ambulatory Visit: Payer: Self-pay

## 2023-06-04 DIAGNOSIS — Z825 Family history of asthma and other chronic lower respiratory diseases: Secondary | ICD-10-CM

## 2023-06-04 DIAGNOSIS — Z807 Family history of other malignant neoplasms of lymphoid, hematopoietic and related tissues: Secondary | ICD-10-CM | POA: Diagnosis not present

## 2023-06-04 DIAGNOSIS — Z9981 Dependence on supplemental oxygen: Secondary | ICD-10-CM

## 2023-06-04 DIAGNOSIS — J9601 Acute respiratory failure with hypoxia: Secondary | ICD-10-CM | POA: Diagnosis present

## 2023-06-04 DIAGNOSIS — Z7901 Long term (current) use of anticoagulants: Secondary | ICD-10-CM

## 2023-06-04 DIAGNOSIS — Z806 Family history of leukemia: Secondary | ICD-10-CM

## 2023-06-04 DIAGNOSIS — J189 Pneumonia, unspecified organism: Secondary | ICD-10-CM | POA: Diagnosis not present

## 2023-06-04 DIAGNOSIS — J9801 Acute bronchospasm: Secondary | ICD-10-CM

## 2023-06-04 DIAGNOSIS — Z6833 Body mass index (BMI) 33.0-33.9, adult: Secondary | ICD-10-CM | POA: Diagnosis not present

## 2023-06-04 DIAGNOSIS — M79604 Pain in right leg: Secondary | ICD-10-CM | POA: Diagnosis not present

## 2023-06-04 DIAGNOSIS — E785 Hyperlipidemia, unspecified: Secondary | ICD-10-CM | POA: Diagnosis present

## 2023-06-04 DIAGNOSIS — Z809 Family history of malignant neoplasm, unspecified: Secondary | ICD-10-CM

## 2023-06-04 DIAGNOSIS — D6851 Activated protein C resistance: Secondary | ICD-10-CM | POA: Diagnosis present

## 2023-06-04 DIAGNOSIS — Z8542 Personal history of malignant neoplasm of other parts of uterus: Secondary | ICD-10-CM | POA: Diagnosis not present

## 2023-06-04 DIAGNOSIS — Z79899 Other long term (current) drug therapy: Secondary | ICD-10-CM

## 2023-06-04 DIAGNOSIS — J440 Chronic obstructive pulmonary disease with acute lower respiratory infection: Secondary | ICD-10-CM | POA: Diagnosis present

## 2023-06-04 DIAGNOSIS — R918 Other nonspecific abnormal finding of lung field: Secondary | ICD-10-CM | POA: Diagnosis not present

## 2023-06-04 DIAGNOSIS — J439 Emphysema, unspecified: Secondary | ICD-10-CM | POA: Diagnosis not present

## 2023-06-04 DIAGNOSIS — R0902 Hypoxemia: Secondary | ICD-10-CM | POA: Diagnosis not present

## 2023-06-04 DIAGNOSIS — Z1152 Encounter for screening for COVID-19: Secondary | ICD-10-CM

## 2023-06-04 DIAGNOSIS — R0981 Nasal congestion: Secondary | ICD-10-CM | POA: Diagnosis not present

## 2023-06-04 DIAGNOSIS — D682 Hereditary deficiency of other clotting factors: Secondary | ICD-10-CM

## 2023-06-04 DIAGNOSIS — Z7983 Long term (current) use of bisphosphonates: Secondary | ICD-10-CM

## 2023-06-04 DIAGNOSIS — D849 Immunodeficiency, unspecified: Secondary | ICD-10-CM | POA: Diagnosis not present

## 2023-06-04 DIAGNOSIS — Z86718 Personal history of other venous thrombosis and embolism: Secondary | ICD-10-CM

## 2023-06-04 DIAGNOSIS — J209 Acute bronchitis, unspecified: Secondary | ICD-10-CM | POA: Diagnosis present

## 2023-06-04 DIAGNOSIS — Z820 Family history of epilepsy and other diseases of the nervous system: Secondary | ICD-10-CM | POA: Diagnosis not present

## 2023-06-04 DIAGNOSIS — R590 Localized enlarged lymph nodes: Secondary | ICD-10-CM | POA: Diagnosis not present

## 2023-06-04 DIAGNOSIS — Z881 Allergy status to other antibiotic agents status: Secondary | ICD-10-CM | POA: Diagnosis not present

## 2023-06-04 DIAGNOSIS — I10 Essential (primary) hypertension: Secondary | ICD-10-CM

## 2023-06-04 DIAGNOSIS — R0689 Other abnormalities of breathing: Secondary | ICD-10-CM | POA: Diagnosis not present

## 2023-06-04 DIAGNOSIS — J80 Acute respiratory distress syndrome: Secondary | ICD-10-CM | POA: Diagnosis not present

## 2023-06-04 DIAGNOSIS — R051 Acute cough: Secondary | ICD-10-CM | POA: Diagnosis not present

## 2023-06-04 DIAGNOSIS — M329 Systemic lupus erythematosus, unspecified: Secondary | ICD-10-CM | POA: Diagnosis not present

## 2023-06-04 DIAGNOSIS — N281 Cyst of kidney, acquired: Secondary | ICD-10-CM | POA: Diagnosis not present

## 2023-06-04 DIAGNOSIS — Z87891 Personal history of nicotine dependence: Secondary | ICD-10-CM | POA: Diagnosis not present

## 2023-06-04 DIAGNOSIS — Z8249 Family history of ischemic heart disease and other diseases of the circulatory system: Secondary | ICD-10-CM | POA: Diagnosis not present

## 2023-06-04 DIAGNOSIS — R062 Wheezing: Secondary | ICD-10-CM | POA: Diagnosis not present

## 2023-06-04 DIAGNOSIS — J40 Bronchitis, not specified as acute or chronic: Secondary | ICD-10-CM | POA: Diagnosis not present

## 2023-06-04 DIAGNOSIS — Z7969 Long term (current) use of other immunomodulators and immunosuppressants: Secondary | ICD-10-CM

## 2023-06-04 DIAGNOSIS — J441 Chronic obstructive pulmonary disease with (acute) exacerbation: Principal | ICD-10-CM | POA: Diagnosis present

## 2023-06-04 DIAGNOSIS — R0682 Tachypnea, not elsewhere classified: Secondary | ICD-10-CM | POA: Diagnosis not present

## 2023-06-04 DIAGNOSIS — R0602 Shortness of breath: Secondary | ICD-10-CM | POA: Diagnosis not present

## 2023-06-04 DIAGNOSIS — E669 Obesity, unspecified: Secondary | ICD-10-CM | POA: Diagnosis not present

## 2023-06-04 DIAGNOSIS — M791 Myalgia, unspecified site: Secondary | ICD-10-CM | POA: Diagnosis not present

## 2023-06-04 DIAGNOSIS — Z8612 Personal history of poliomyelitis: Secondary | ICD-10-CM

## 2023-06-04 DIAGNOSIS — R5381 Other malaise: Secondary | ICD-10-CM | POA: Diagnosis not present

## 2023-06-04 LAB — CBC
HCT: 33.8 % — ABNORMAL LOW (ref 36.0–46.0)
Hemoglobin: 10.8 g/dL — ABNORMAL LOW (ref 12.0–15.0)
MCH: 29.5 pg (ref 26.0–34.0)
MCHC: 32 g/dL (ref 30.0–36.0)
MCV: 92.3 fL (ref 80.0–100.0)
Platelets: 150 10*3/uL (ref 150–400)
RBC: 3.66 MIL/uL — ABNORMAL LOW (ref 3.87–5.11)
RDW: 15.1 % (ref 11.5–15.5)
WBC: 7.7 10*3/uL (ref 4.0–10.5)
nRBC: 0 % (ref 0.0–0.2)

## 2023-06-04 LAB — COMPREHENSIVE METABOLIC PANEL
ALT: 27 U/L (ref 0–44)
AST: 38 U/L (ref 15–41)
Albumin: 3 g/dL — ABNORMAL LOW (ref 3.5–5.0)
Alkaline Phosphatase: 40 U/L (ref 38–126)
Anion gap: 15 (ref 5–15)
BUN: 18 mg/dL (ref 8–23)
CO2: 18 mmol/L — ABNORMAL LOW (ref 22–32)
Calcium: 8.8 mg/dL — ABNORMAL LOW (ref 8.9–10.3)
Chloride: 101 mmol/L (ref 98–111)
Creatinine, Ser: 0.77 mg/dL (ref 0.44–1.00)
GFR, Estimated: >60 mL/min (ref >60)
Glucose, Bld: 76 mg/dL (ref 70–99)
Potassium: 4.1 mmol/L (ref 3.5–5.1)
Sodium: 134 mmol/L — ABNORMAL LOW (ref 135–145)
Total Bilirubin: 0.8 mg/dL (ref <1.2)
Total Protein: 6.8 g/dL (ref 6.5–8.1)

## 2023-06-04 LAB — BRAIN NATRIURETIC PEPTIDE: B Natriuretic Peptide: 171.9 pg/mL — ABNORMAL HIGH (ref 0.0–100.0)

## 2023-06-04 LAB — RESP PANEL BY RT-PCR (RSV, FLU A&B, COVID)  RVPGX2
Influenza A by PCR: NEGATIVE
Influenza B by PCR: NEGATIVE
Resp Syncytial Virus by PCR: NEGATIVE
SARS Coronavirus 2 by RT PCR: NEGATIVE

## 2023-06-04 LAB — PROCALCITONIN: Procalcitonin: 1.03 ng/mL

## 2023-06-04 MED ORDER — ENSURE ENLIVE PO LIQD
237.0000 mL | Freq: Two times a day (BID) | ORAL | Status: DC
  Administered 2023-06-05 – 2023-06-07 (×3): 237 mL via ORAL
  Filled 2023-06-04: qty 237

## 2023-06-04 MED ORDER — ALBUTEROL SULFATE (2.5 MG/3ML) 0.083% IN NEBU
2.5000 mg | INHALATION_SOLUTION | Freq: Once | RESPIRATORY_TRACT | Status: AC
  Administered 2023-06-04: 2.5 mg via RESPIRATORY_TRACT
  Filled 2023-06-04: qty 3

## 2023-06-04 MED ORDER — HYDROXYCHLOROQUINE SULFATE 200 MG PO TABS
200.0000 mg | ORAL_TABLET | Freq: Two times a day (BID) | ORAL | Status: DC
  Administered 2023-06-04 – 2023-06-08 (×8): 200 mg via ORAL
  Filled 2023-06-04 (×8): qty 1

## 2023-06-04 MED ORDER — SODIUM CHLORIDE 0.9 % IV SOLN
2.0000 g | Freq: Once | INTRAVENOUS | Status: AC
  Administered 2023-06-04: 2 g via INTRAVENOUS
  Filled 2023-06-04: qty 20

## 2023-06-04 MED ORDER — ALBUTEROL SULFATE (2.5 MG/3ML) 0.083% IN NEBU: 2.5000 mg | INHALATION_SOLUTION | RESPIRATORY_TRACT | Status: DC | PRN

## 2023-06-04 MED ORDER — ACETAMINOPHEN 325 MG PO TABS: 650.0000 mg | ORAL_TABLET | Freq: Four times a day (QID) | ORAL | Status: DC | PRN

## 2023-06-04 MED ORDER — ALBUTEROL SULFATE (2.5 MG/3ML) 0.083% IN NEBU
2.5000 mg | INHALATION_SOLUTION | Freq: Four times a day (QID) | RESPIRATORY_TRACT | Status: DC
  Administered 2023-06-04: 2.5 mg via RESPIRATORY_TRACT
  Filled 2023-06-04: qty 3

## 2023-06-04 MED ORDER — SODIUM CHLORIDE 0.9 % IV SOLN
1.0000 g | Freq: Once | INTRAVENOUS | Status: DC
  Filled 2023-06-04: qty 10

## 2023-06-04 MED ORDER — RISAQUAD PO CAPS
1.0000 | ORAL_CAPSULE | Freq: Two times a day (BID) | ORAL | Status: DC
  Administered 2023-06-04 – 2023-06-08 (×9): 1 via ORAL
  Filled 2023-06-04 (×11): qty 1

## 2023-06-04 MED ORDER — SODIUM CHLORIDE 0.9% FLUSH
3.0000 mL | Freq: Two times a day (BID) | INTRAVENOUS | Status: DC
  Administered 2023-06-04 – 2023-06-08 (×9): 3 mL via INTRAVENOUS

## 2023-06-04 MED ORDER — GUAIFENESIN ER 600 MG PO TB12
600.0000 mg | ORAL_TABLET | Freq: Two times a day (BID) | ORAL | Status: DC
  Administered 2023-06-04 – 2023-06-08 (×9): 600 mg via ORAL
  Filled 2023-06-04 (×9): qty 1

## 2023-06-04 MED ORDER — ACETAMINOPHEN 650 MG RE SUPP: 650.0000 mg | Freq: Four times a day (QID) | RECTAL | Status: DC | PRN

## 2023-06-04 MED ORDER — AZITHROMYCIN 250 MG PO TABS
500.0000 mg | ORAL_TABLET | Freq: Once | ORAL | Status: AC
Start: 2023-06-04 — End: 2023-06-04
  Administered 2023-06-04: 500 mg via ORAL
  Filled 2023-06-04: qty 2

## 2023-06-04 MED ORDER — RIVAROXABAN 10 MG PO TABS
10.0000 mg | ORAL_TABLET | Freq: Every day | ORAL | Status: DC
  Administered 2023-06-04 – 2023-06-07 (×4): 10 mg via ORAL
  Filled 2023-06-04 (×4): qty 1

## 2023-06-04 MED ORDER — PREDNISONE 20 MG PO TABS
40.0000 mg | ORAL_TABLET | Freq: Every day | ORAL | Status: DC
  Administered 2023-06-05 – 2023-06-06 (×2): 40 mg via ORAL
  Filled 2023-06-04 (×2): qty 2

## 2023-06-04 MED ORDER — LABETALOL HCL 5 MG/ML IV SOLN: 5.0000 mg | INTRAVENOUS | Status: DC | PRN

## 2023-06-04 MED ORDER — ALBUTEROL SULFATE (2.5 MG/3ML) 0.083% IN NEBU
2.5000 mg | INHALATION_SOLUTION | Freq: Three times a day (TID) | RESPIRATORY_TRACT | Status: DC
Start: 2023-06-04 — End: 2023-06-06
  Administered 2023-06-04 – 2023-06-06 (×5): 2.5 mg via RESPIRATORY_TRACT
  Filled 2023-06-04 (×5): qty 3

## 2023-06-04 MED ORDER — IOHEXOL 350 MG/ML SOLN
75.0000 mL | Freq: Once | INTRAVENOUS | Status: AC | PRN
  Administered 2023-06-04: 75 mL via INTRAVENOUS

## 2023-06-04 NOTE — H&P (Addendum)
History and Physical    Patient: Jessica Orr BMW:[id number] DOB: 05/04/1946 DOA: 06/04/2023 DOS: the patient was seen and examined on 06/04/2023 PCP: Aliene Beams, MD  Patient coming from: Via EMS  Chief Complaint:  Chief Complaint  Patient presents with   Shortness of Breath   HPI: Jessica Orr is a 77 y.o. female with medical history significant of hypertension, COPD, lupus on Plaquenil with factor V Leiden deficiency, DVT on anticoagulation of Xarelto, polio, and uterine cancer who presents with complaints of shortness of breath.  She has had labored breathing, productive cough with brownish-green sputum, and hacking. Symptoms began one week ago, starting with a runny nose and progressing to hacking cough, labored breathing, and loss of appetite. Patient reports urinary incontinence with sneezing, which later progressed to fecal incontinence. Patient has had minimal oral intake over the past week, consuming only four pieces of toast and a quarter of a honey bun. Patient denies chest pain, but reports new-onset discomfort in the upper leg above the knee.  Denied any recent fall or trauma to her leg to onset symptoms.  Patient has missed the last two doses of Xarelto and Plaquenil. Patient has a history of factor V Leiden deficiency, lupus, and connective tissue disease.  Normally patient is not on oxygen at baseline.  She had been evaluated at urgent care earlier today and diagnosed with pneumonia for which she was sent to the hospital for further evaluation. At the urgent care center she had been given a DuoNeb breathing treatment.  En route with EMS she was given 125 mg of Solu-Medrol IV.  Upon admission into the emergency department patient was noted to be afebrile with respirations 18-38, blood pressures maintained, and O2 saturations noted to be as low as 88% with ambulation for which she was placed on 2 L of nasal cannula oxygen.  Labs noted WBC 7.7, hemoglobin 10.8, sodium  134, CO2 18, and BNP 171.9.  Chest x-ray noted bibasilar diffuse reticular opacities with bronchial cuffing suggestive of bronchitis with underlying emphysema.  Influenza, COVID-19, and RSV screening were negative.  Patient was started on empiric antibiotics of Rocephin and azithromycin  Review of Systems: As mentioned in the history of present illness. All other systems reviewed and are negative. Past Medical History:  Diagnosis Date   Cancer [facility])    uterine   COPD (chronic obstructive pulmonary disease) ([facility])    DVT (deep venous thrombosis) ([facility])    Esophageal stricture    Factor V deficiency ([facility])    Hyperlipidemia    Hypertension    Polio 1950   Past Surgical History:  Procedure Laterality Date   ESOPHAGEAL MANOMETRY N/A 07/13/2020   Procedure: ESOPHAGEAL MANOMETRY (EM);  Surgeon: Charlott Rakes, MD;  Location: [facility];  Service: Gastroenterology;  Laterality: N/A;   nephrolithiasis     Social History:  reports that she quit smoking about 7 years ago. Her smoking use included cigarettes. She started smoking about 57 years ago. She has a 50 pack-year smoking history. She has never used smokeless tobacco. She reports that she does not drink alcohol and does not use drugs.  Allergies  Allergen Reactions   Ciprofloxacin Anaphylaxis    unknown    Family History  Problem Relation Age of Onset   Hodgkin's lymphoma Mother    Heart attack Father    Alzheimer's disease Father    Hypertension Father    Cancer Maternal Grandmother    Emphysema Maternal Grandfather    Leukemia Paternal Grandmother  Prior to Admission medications   Medication Sig Start Date End Date Taking? Authorizing Provider  acetaminophen (TYLENOL) 500 MG tablet Take 500 mg by mouth every 6 (six) hours as needed.    [provider]  alendronate (FOSAMAX) 70 MG tablet Take 70 mg by mouth once a week. 02/03/20   [provider]  amLODipine (NORVASC) 2.5 MG tablet  10/24/16   [provider]  atorvastatin (LIPITOR) 10 MG tablet Take 10 mg by mouth daily. Take one at bedtime    [provider]  Cholecalciferol (VITAMIN D3) 50 MCG (2000 UT) capsule 2 capsules    [provider]  hydroxychloroquine (PLAQUENIL) 200 MG tablet Take 200 mg by mouth 2 (two) times daily. 06/28/19   [provider]  Multiple Vitamins-Minerals (VITAMIN D3 COMPLETE PO) Take by mouth. Taking 2000 U tablets - 2 tablets once a day    [provider]  potassium chloride (K-DUR) 10 MEQ tablet Take 10 mEq by mouth daily.    [provider]  XARELTO 10 MG TABS tablet Take 10 mg by mouth daily. 01/11/20   [provider]    Physical Exam: Vitals:   06/04/23 1325 06/04/23 1326 06/04/23 1330 06/04/23 1333  BP:   134/60   Pulse: 88 81 79   Resp: (!) 28 18 (!) 32   Temp:    98.2 F (36.8 C)  TempSrc:    Oral  SpO2: 93% 93% 94%   Weight:      Height:        Constitutional: Elderly female who appears to be ill, but able to follow commands Eyes: PERRL, lids and conjunctivae normal ENMT: Mucous membranes are moist.  Fair dentition. Neck: normal, supple, no significant JVD Respiratory: Mildly tachypneic with expiratory wheezes appreciated in both lung fields.  Patient currently on 2 L nasal cannula oxygen. Cardiovascular: Regular rate and rhythm.  No significant lower extremity edema appreciated. Abdomen: no tenderness, no masses palpated. No hepatosplenomegaly. Bowel sounds positive.  Musculoskeletal: no clubbing / cyanosis. No joint deformity upper and lower extremities.  Tenderness to palpation of the right upper thigh. Skin: no rashes, lesions, ulcers. No induration Neurologic: CN 2-12 grossly intact.   Strength 5/5 in all 4.  Psychiatric: Normal judgment and insight. Alert and oriented x 3. Normal mood.   Data Reviewed:  EKG reveals sinus rhythm at 81 bpm.  Reviewed labs, imaging, and pertinent records as documented.  Assessment and  Plan:  Acute respiratory failure with hypoxia secondary to suspected Bronchitis/COPD exacerbation versus community-acquired pneumonia Acute.  Patient presents with labored breathing, coughing, and hacking for one week.  On physical exam patient noted to have significant wheezing.Cathlean Sauer findings were suggestive of bronchitis.  Patient was otherwise noted to be afebrile, but with new oxygen requirement as O2 saturations dropped down to 88% on room air with ambulation.  Patient was placed on 2 L nasal cannula oxygen.  Influenza, COVID-19, and RSV screening were negative.  She has been given breathing treatments and Solu-Medrol 125 mg prior to arrival.  As patient is immunocompromised she was started on empiric antibiotics of Rocephin and azithromycin.  Differential bronchitis/COPD exacerbation (patient has significant history of prior tobacco abuse, but no prior formal pulmonary function testing)  versus community-acquired pneumonia versus pulmonary embolism with missed doses of Xarelto versus heart failure as BNP was elevated at 171.9. -Admit to a medical telemetry bed -Continuous pulse oximetry with nasal cannula oxygen and O2 saturation greater than 90% -Incentive spirometry and  flutter valve -Check procalcitonin, urine Legionella, urine strep -Check CT angiogram of the chest -Continue empiric antibiotics of Rocephin and azithromycin -Albuterol nebs 4 times daily and as needed -Prednisone 40 mg every morning -Mucinex  Factor V Leiden deficiency History of DVT Patient has a history of factor V Leiden deficiency and missed two doses of Xarelto. New onset pain reported above the knee. No chest pain reported. Concern for possible blood clot due to missed anticoagulation doses.  Also notes poor appetite for which may lead to poor absorption. -Follow up CT angiogram of the chest -Check Doppler ultrasound of the right lower extremity -Continue Xarelto, but May need to place temporarily on  heparin  Lupus Patient has a history of lupus/connective tissue disease. Missed two doses of Plaquenil. - Continue Plaquenil  Essential hypertension Blood pressures noted to be elevated up to 143/94.  Previously it appears patient was on amlodipine, but unclear if patient still taking this medicine.  Poor Oral Intake Assessment: Patient reports significant decrease in appetite and oral intake over the past week. Plan: - Monitor nutritional status.  Ensure shakes - Encourage oral intake as tolerated  Obesity BMI 33.41 kg/m  DVT prophylaxis: Xarelto Advance Care Planning:   Code Status: Full Code   Consults: None  Family Communication: Updated at bedside  Severity of Illness: The appropriate patient status for this patient is INPATIENT. Inpatient status is judged to be reasonable and necessary in order to provide the required intensity of service to ensure the patient's safety. The patient's presenting symptoms, physical exam findings, and initial radiographic and laboratory data in the context of their chronic comorbidities is felt to place them at high risk for further clinical deterioration. Furthermore, it is not anticipated that the patient will be medically stable for discharge from the hospital within 2 midnights of admission.   * I certify that at the point of admission it is my clinical judgment that the patient will require inpatient hospital care spanning beyond 2 midnights from the point of admission due to high intensity of service, high risk for further deterioration and high frequency of surveillance required.*  Author: Clydie Braun, MD 06/04/2023 2:43 PM  For on call review [email or web address].

## 2023-06-04 NOTE — ED Notes (Signed)
Placed pt on 2L 02 per Naplate.

## 2023-06-04 NOTE — ED Notes (Signed)
Pt's walking Sa02 87% on RA

## 2023-06-04 NOTE — Plan of Care (Signed)
Problem: Education: Goal: Knowledge of General Education information will improve Description: Including pain rating scale, medication(s)/side effects and non-pharmacologic comfort measures Outcome: Progressing   Problem: Clinical Measurements: Goal: Will remain free from infection Outcome: Progressing Goal: Cardiovascular complication will be avoided Outcome: Progressing

## 2023-06-04 NOTE — ED Notes (Signed)
ED TO INPATIENT HANDOFF REPORT  ED Nurse Name and Phone #: Percival Spanish [phone]  S Name/Age/Gender Jessica Orr 77 y.o. female Room/Bed: 034C/034C  Code Status   Code Status: Not on file  Home/SNF/Other Home Patient oriented to: self, place, time, and situation Is this baseline? Yes   Triage Complete: Triage complete  Chief Complaint Acute respiratory failure with hypoxia (HCC) [J96.01]  Triage Note Pt arrived via [facility] from [facility] for hypoxia and chest x-ray the showed pneumonia. Per EMS, UC said pt Sa02 was 88% on RA. Per ems, pt is tachypneic and gets dyspnea w/exertion. Pt is A&Ox4. Per EMS, UC gave duoneb. EMS gave solumedrol 125 mg IV. Pt c/o nasal drainage and congestion, cough, SOBx1wk. Pt c/o productive cough w/green/brown mucousx5d.   Allergies Allergies  Allergen Reactions   Ciprofloxacin Anaphylaxis    unknown    Level of Care/Admitting Diagnosis ED Disposition     ED Disposition  Admit   Condition  --   Comment  Hospital Area: [facility] [[id number]]  Level of Care: Telemetry Medical [104]  May admit patient to [facility] or [facility] if equivalent level of care is available:: No  Covid Evaluation: Asymptomatic - no recent exposure (last 10 days) testing not required  Diagnosis: Acute respiratory failure with hypoxia [facility]) [[id number]]  Admitting Physician: Clydie Braun [[id number]]  Attending Physician: Clydie Braun [[id number]]  Certification:: I certify this patient will need inpatient services for at least 2 midnights  Expected Medical Readiness: 06/06/2023          B Medical/Surgery History Past Medical History:  Diagnosis Date   Cancer (HCC)    uterine   COPD (chronic obstructive pulmonary disease) (HCC)    DVT (deep venous thrombosis) (HCC)    Esophageal stricture    Factor V deficiency (HCC)    Hyperlipidemia    Hypertension    Polio 1950   Past Surgical History:  Procedure Laterality Date   ESOPHAGEAL  MANOMETRY N/A 07/13/2020   Procedure: ESOPHAGEAL MANOMETRY (EM);  Surgeon: Charlott Rakes, MD;  Location: [facility] ENDOSCOPY;  Service: Gastroenterology;  Laterality: N/A;   nephrolithiasis       A IV Location/Drains/Wounds Patient Lines/Drains/Airways Status     Active Line/Drains/Airways     Name Placement date Placement time Site Days   Peripheral IV 06/04/23 20 G Anterior;Left Wrist 06/04/23  --  Wrist  less than 1            Intake/Output Last 24 hours No intake or output data in the 24 hours ending 06/04/23 1457  Labs/Imaging Results for orders placed or performed during the hospital encounter of 06/04/23 (from the past 48 hour(s))  Resp panel by RT-PCR (RSV, Flu A&B, Covid) Anterior Nasal Swab     Status: None   Collection Time: 06/04/23 10:39 AM   Specimen: Anterior Nasal Swab  Result Value Ref Range   SARS Coronavirus 2 by RT PCR NEGATIVE NEGATIVE   Influenza A by PCR NEGATIVE NEGATIVE   Influenza B by PCR NEGATIVE NEGATIVE    Comment: (NOTE) The Xpert Xpress SARS-CoV-2/FLU/RSV plus assay is intended as an aid in the diagnosis of influenza from Nasopharyngeal swab specimens and should not be used as a sole basis for treatment. Nasal washings and aspirates are unacceptable for Xpert Xpress SARS-CoV-2/FLU/RSV testing.  Fact Sheet for Patients: BloggerCourse.com  Fact Sheet for Healthcare Providers: SeriousBroker.it  This test is not yet approved or cleared by the [place] FDA and has been  authorized for detection and/or diagnosis of SARS-CoV-2 by FDA under an Emergency Use Authorization (EUA). This EUA will remain in effect (meaning this test can be used) for the duration of the COVID-19 declaration under Section 564(b)(1) of the Act, 21 U.S.C. section 360bbb-3(b)(1), unless the authorization is terminated or revoked.     Resp Syncytial Virus by PCR NEGATIVE NEGATIVE    Comment: (NOTE) Fact Sheet for  Patients: BloggerCourse.com  Fact Sheet for Healthcare Providers: SeriousBroker.it  This test is not yet approved or cleared by the [place] FDA and has been authorized for detection and/or diagnosis of SARS-CoV-2 by FDA under an Emergency Use Authorization (EUA). This EUA will remain in effect (meaning this test can be used) for the duration of the COVID-19 declaration under Section 564(b)(1) of the Act, 21 U.S.C. section 360bbb-3(b)(1), unless the authorization is terminated or revoked.  Performed at [facility], [address] [place], [place] [address]   CBC     Status: Abnormal   Collection Time: 06/04/23 10:39 AM  Result Value Ref Range   WBC 7.7 4.0 - 10.5 K/uL   RBC 3.66 (L) 3.87 - 5.11 MIL/uL   Hemoglobin 10.8 (L) 12.0 - 15.0 g/dL   HCT 10.9 (L) 32.3 - 55.7 %   MCV 92.3 80.0 - 100.0 fL   MCH 29.5 26.0 - 34.0 pg   MCHC 32.0 30.0 - 36.0 g/dL   RDW 32.2 02.5 - 42.7 %   Platelets 150 150 - 400 K/uL   nRBC 0.0 0.0 - 0.2 %    Comment: Performed at [facility], [address] [place], [place] [address]  Brain natriuretic peptide     Status: Abnormal   Collection Time: 06/04/23 10:39 AM  Result Value Ref Range   B Natriuretic Peptide 171.9 (H) 0.0 - 100.0 pg/mL    Comment: Performed at [facility], [address], [place], [place] [address]  Comprehensive metabolic panel     Status: Abnormal   Collection Time: 06/04/23 12:00 PM  Result Value Ref Range   Sodium 134 (L) 135 - 145 mmol/L   Potassium 4.1 3.5 - 5.1 mmol/L   Chloride 101 98 - 111 mmol/L   CO2 18 (L) 22 - 32 mmol/L   Glucose, Bld 76 70 - 99 mg/dL    Comment: Glucose reference range applies only to samples taken after fasting for at least 8 hours.   BUN 18 8 - 23 mg/dL   Creatinine, Ser 5.17 0.44 - 1.00 mg/dL   Calcium 8.8 (L) 8.9 - 10.3 mg/dL   Total Protein 6.8 6.5 - 8.1 g/dL   Albumin 3.0 (L) 3.5 - 5.0 g/dL   AST 38 15 - 41 U/L    ALT 27 0 - 44 U/L   Alkaline Phosphatase 40 38 - 126 U/L   Total Bilirubin 0.8 <1.2 mg/dL   GFR, Estimated >61 >60 mL/min    Comment: (NOTE) Calculated using the CKD-EPI Creatinine Equation (2021)    Anion gap 15 5 - 15    Comment: Performed at [facility], [address], [place], [place] [address]   DG Chest Port 1 View  Result Date: 06/04/2023 CLINICAL DATA:  Shortness of breath EXAM: PORTABLE CHEST 1 VIEW COMPARISON:  July 18, 2022 FINDINGS: The cardiomediastinal silhouette is unchanged in contour.Emphysematous changes. No pleural effusion. No pneumothorax. Bibasilar diffuse reticular opacities with bronchial cuffing. IMPRESSION: Bibasilar diffuse reticular opacities with bronchial cuffing. Findings are favored to reflect  bronchitis. Followup PA and lateral chest X-ray is recommended in 4-6 weeks to ensure resolution. Given underlying emphysema, recommend evaluation for candidacy for annual lung cancer screening CT. Electronically Signed   By: Meda Klinefelter M.D.   On: 06/04/2023 11:41    Pending Labs Unresulted Labs (From admission, onward)    None       Vitals/Pain Today's Vitals   06/04/23 1325 06/04/23 1326 06/04/23 1330 06/04/23 1333  BP:   134/60   Pulse: 88 81 79   Resp: (!) 28 18 (!) 32   Temp:    98.2 F (36.8 C)  TempSrc:    Oral  SpO2: 93% 93% 94%   Weight:      Height:      PainSc:        Isolation Precautions No active isolations  Medications Medications  cefTRIAXone (ROCEPHIN) 1 g in sodium chloride 0.9 % 100 mL IVPB (has no administration in time range)  azithromycin (ZITHROMAX) tablet 500 mg (has no administration in time range)  albuterol (PROVENTIL) (2.5 MG/3ML) 0.083% nebulizer solution 2.5 mg (2.5 mg Nebulization Given 06/04/23 1133)    Mobility walks with device     Focused Assessments Pulmonary Assessment Handoff:  Lung sounds: Bilateral Breath Sounds: Expiratory wheezes R Breath Sounds: Diminished (RLL) O2 Device:  Room Air      R Recommendations: See Admitting Provider Note  Report given to:   Additional Notes:

## 2023-06-04 NOTE — ED Provider Notes (Signed)
[place] EMERGENCY DEPARTMENT AT [facility] Provider Note   CSN: [id number] Arrival date & time: 06/04/23  1015     History {Add pertinent medical, surgical, social history, OB history to HPI:1} Chief Complaint  Patient presents with   Shortness of Breath    Jessica Orr is a 77 y.o. female.   Shortness of Breath    Pt complaining of productive cough yellow sputum since last week.  Increase shortness of breath with lying down acitivity.  SOme post nasal drip and throat irriation.  No known fever or ill contact . Decreased po appetite.  Pt went to an urgent care and was told she had pneumonia and had to come to the ED.  Pt came by EMS and was given duoneb and solumedrol at the urgent care. Home Medications Prior to Admission medications   Medication Sig Start Date End Date Taking? Authorizing Provider  acetaminophen (TYLENOL) 500 MG tablet Take 500 mg by mouth every 6 (six) hours as needed.    [provider]  alendronate (FOSAMAX) 70 MG tablet Take 70 mg by mouth once a week. 02/03/20   [provider]  amLODipine (NORVASC) 2.5 MG tablet  10/24/16   [provider]  atorvastatin (LIPITOR) 10 MG tablet Take 10 mg by mouth daily. Take one at bedtime    [provider]  Cholecalciferol (VITAMIN D3) 50 MCG (2000 UT) capsule 2 capsules    [provider]  hydroxychloroquine (PLAQUENIL) 200 MG tablet Take 200 mg by mouth 2 (two) times daily. 06/28/19   [provider]  Multiple Vitamins-Minerals (VITAMIN D3 COMPLETE PO) Take by mouth. Taking 2000 U tablets - 2 tablets once a day    [provider]  potassium chloride (K-DUR) 10 MEQ tablet Take 10 mEq by mouth daily.    [provider]  XARELTO 10 MG TABS tablet Take 10 mg by mouth daily. 01/11/20   [provider]      Allergies    Ciprofloxacin    Review of Systems   Review of Systems  Respiratory:  Positive for shortness of breath.      Physical Exam Updated Vital Signs BP 117/62   Pulse 81   Temp 98.2 F (36.8 C) (Oral)   Resp (!) 32   Ht 1.537 m (5' 0.5")   Wt 78.9 kg   SpO2 94%   BMI 33.41 kg/m  Physical Exam Vitals and nursing note reviewed.  Constitutional:      General: She is not in acute distress.    Appearance: She is well-developed.  HENT:     Head: Normocephalic and atraumatic.     Right Ear: External ear normal.     Left Ear: External ear normal.  Eyes:     General: No scleral icterus.       Right eye: No discharge.        Left eye: No discharge.     Conjunctiva/sclera: Conjunctivae normal.  Neck:     Trachea: No tracheal deviation.  Cardiovascular:     Rate and Rhythm: Normal rate and regular rhythm.  Pulmonary:     Effort: Pulmonary effort is normal. No respiratory distress.     Breath sounds: No stridor. Wheezing present. No rales.  Abdominal:     General: Bowel sounds are normal. There is no distension.     Palpations: Abdomen is soft.     Tenderness: There is no abdominal tenderness. There is no guarding or rebound.  Musculoskeletal:  General: No tenderness or deformity.     Cervical back: Neck supple.     Right lower leg: No edema.     Left lower leg: No edema.  Skin:    General: Skin is warm and dry.     Findings: No rash.  Neurological:     General: No focal deficit present.     Mental Status: She is alert.     Cranial Nerves: No cranial nerve deficit, dysarthria or facial asymmetry.     Sensory: No sensory deficit.     Motor: No abnormal muscle tone or seizure activity.     Coordination: Coordination normal.  Psychiatric:        Mood and Affect: Mood normal.     ED Results / Procedures / Treatments   Labs (all labs ordered are listed, but only abnormal results are displayed) Labs Reviewed  RESP PANEL BY RT-PCR (RSV, FLU A&B, COVID)  [id number]  COMPREHENSIVE METABOLIC PANEL  CBC    EKG None  Radiology No results found.  Procedures Procedures   {Document cardiac monitor, telemetry assessment procedure when appropriate:1}  Medications Ordered in ED Medications - No data to display  ED Course/ Medical Decision Making/ A&P   {   Click here for ABCD2, HEART and other calculatorsREFRESH Note before signing :1}                              Medical Decision Making Amount and/or Complexity of Data Reviewed Labs: ordered. Radiology: ordered.   ***  {Document critical care time when appropriate:1} {Document review of labs and clinical decision tools ie heart score, Chads2Vasc2 etc:1}  {Document your independent review of radiology images, and any outside records:1} {Document your discussion with family members, caretakers, and with consultants:1} {Document social determinants of health affecting pt's care:1} {Document your decision making why or why not admission, treatments were needed:1} Final Clinical Impression(s) / ED Diagnoses Final diagnoses:  None    Rx / DC Orders ED Discharge Orders     None

## 2023-06-04 NOTE — ED Triage Notes (Signed)
Pt arrived via [facility] from [facility] UC for hypoxia and chest x-ray the showed pneumonia. Per EMS, UC said pt Sa02 was 88% on RA. Per ems, pt is tachypneic and gets dyspnea w/exertion. Pt is A&Ox4. Per EMS, UC gave duoneb. EMS gave solumedrol 125 mg IV. Pt c/o nasal drainage and congestion, cough, SOBx1wk. Pt c/o productive cough w/green/brown mucousx5d.

## 2023-06-05 ENCOUNTER — Inpatient Hospital Stay (HOSPITAL_COMMUNITY): Payer: Medicare Other

## 2023-06-05 DIAGNOSIS — M79604 Pain in right leg: Secondary | ICD-10-CM | POA: Diagnosis not present

## 2023-06-05 DIAGNOSIS — Z86718 Personal history of other venous thrombosis and embolism: Secondary | ICD-10-CM | POA: Diagnosis not present

## 2023-06-05 DIAGNOSIS — J9601 Acute respiratory failure with hypoxia: Secondary | ICD-10-CM | POA: Diagnosis not present

## 2023-06-05 DIAGNOSIS — J40 Bronchitis, not specified as acute or chronic: Secondary | ICD-10-CM | POA: Diagnosis not present

## 2023-06-05 DIAGNOSIS — D682 Hereditary deficiency of other clotting factors: Secondary | ICD-10-CM | POA: Diagnosis not present

## 2023-06-05 LAB — CBC
HCT: 35.7 % — ABNORMAL LOW (ref 36.0–46.0)
Hemoglobin: 11.7 g/dL — ABNORMAL LOW (ref 12.0–15.0)
MCH: 29.7 pg (ref 26.0–34.0)
MCHC: 32.8 g/dL (ref 30.0–36.0)
MCV: 90.6 fL (ref 80.0–100.0)
Platelets: 198 10*3/uL (ref 150–400)
RBC: 3.94 MIL/uL (ref 3.87–5.11)
RDW: 14.8 % (ref 11.5–15.5)
WBC: 9.5 10*3/uL (ref 4.0–10.5)
nRBC: 0 % (ref 0.0–0.2)

## 2023-06-05 LAB — BASIC METABOLIC PANEL
Anion gap: 10 (ref 5–15)
BUN: 20 mg/dL (ref 8–23)
CO2: 20 mmol/L — ABNORMAL LOW (ref 22–32)
Calcium: 8.7 mg/dL — ABNORMAL LOW (ref 8.9–10.3)
Chloride: 106 mmol/L (ref 98–111)
Creatinine, Ser: 0.75 mg/dL (ref 0.44–1.00)
GFR, Estimated: >60 mL/min (ref >60)
Glucose, Bld: 119 mg/dL — ABNORMAL HIGH (ref 70–99)
Potassium: 3.6 mmol/L (ref 3.5–5.1)
Sodium: 136 mmol/L (ref 135–145)

## 2023-06-05 MED ORDER — AZITHROMYCIN 500 MG PO TABS
500.0000 mg | ORAL_TABLET | Freq: Every day | ORAL | Status: AC
  Administered 2023-06-05 – 2023-06-06 (×2): 500 mg via ORAL
  Filled 2023-06-05 (×2): qty 1

## 2023-06-05 MED ORDER — BENZONATATE 100 MG PO CAPS: 200.0000 mg | ORAL_CAPSULE | Freq: Three times a day (TID) | ORAL | Status: DC | PRN

## 2023-06-05 NOTE — Plan of Care (Signed)
Pt has rested quietly throughout the night with on distress. Alert and oriented. On room air. SR on the monitor. No complaints voiced.     Problem: Clinical Measurements: Goal: Respiratory complications will improve Outcome: Progressing Goal: Cardiovascular complication will be avoided Outcome: Progressing   Problem: Coping: Goal: Level of anxiety will decrease Outcome: Progressing   Problem: Pain Management: Goal: General experience of comfort will improve Outcome: Progressing

## 2023-06-05 NOTE — Progress Notes (Signed)
   06/05/23 1202  Spiritual Encounters  Type of Visit Initial  Care provided to: Patient  Conversation partners present during encounter Nurse  Referral source Patient request  Reason for visit Routine spiritual support  OnCall Visit No   Responded to request for prayer. Visited with patient for a while, had great conversation. Provided patient with prayer and a Rosary.

## 2023-06-05 NOTE — Progress Notes (Signed)
PROGRESS NOTE        PATIENT DETAILS Name: Jessica Orr Age: 77 y.o. Sex: female Date of Birth: 04/21/1946 Admit Date: 06/04/2023 Admitting Physician Clydie Braun, MD ZOX:WRUEAV, Fleet Contras, MD  Brief Summary: Patient is a 77 y.o.  female with history of recurrent DVT secondary to factor V Leiden mutation on Xarelto, COPD, HTN-presented with cough/shortness of breath for approximately 4-5 days prior to this hospitalization-she was found to have acute bronchitis causing COPD exacerbation and subsequently admitted to the hospitalist service.  Significant events: 11/24>> admit to [facility]  Significant studies: 11/24>> CTA chest: No PE, thickening of bronchial walls-seen in bronchitis.  Significant microbiology data: 11/24>> COVID/influenza/RSV PCR: Negative  Procedures: None  Consults: None  Subjective: Feels better-but still coughing-wheezing not yet at baseline.  Objective: Vitals: Blood pressure (!) 118/57, pulse 70, temperature 98.2 F (36.8 C), temperature source Oral, resp. rate (!) 26, height 5' 0.5" (1.537 m), weight 78.9 kg, SpO2 91%.   Exam: Gen Exam:Alert awake-not in any distress HEENT:atraumatic, normocephalic Chest: Moving air well-some scattered rhonchi. CVS:S1S2 regular Abdomen:soft non tender, non distended Extremities:no edema Neurology: Non focal Skin: no rash  Pertinent Labs/Radiology:    Latest Ref Rng & Units 06/05/2023    4:36 AM 06/04/2023   10:39 AM 08/21/2019   11:07 AM  CBC  WBC 4.0 - 10.5 K/uL 9.5  7.7  6.3   Hemoglobin 12.0 - 15.0 g/dL 40.9  81.1  91.4   Hematocrit 36.0 - 46.0 % 35.7  33.8  41.1   Platelets 150 - 400 K/uL 198  150  229     Lab Results  Component Value Date   NA 136 06/05/2023   K 3.6 06/05/2023   CL 106 06/05/2023   CO2 20 (L) 06/05/2023      Assessment/Plan: Acute hypoxic respiratory failure secondary to acute bronchitis with COPD exacerbation Slowly improving-moving air  well-with diffuse rhonchi-not still yet back to baseline Continue steroids/bronchodilators Zithromax x 3 days total Pulmonary toileting/mobilization Wean off oxygen as tolerated. Follow clinical course and see how she does  History of VTE-factor V Leiden mutation CTA chest negative for PE Lower extremity Dopplers pending Continue Xarelto  History of lupus Plaquenil  HTN BP stable-without the use of any antihypertensives  Debility/deconditioning Prior history of polio PT/OT eval  Exophytic mass arising from right kidney Seen incidentally on CTA chest Further workup/imaging can be deferred to the outpatient-when she is more stable.  Obesity: Estimated body mass index is 33.41 kg/m as calculated from the following:   Height as of this encounter: 5' 0.5" (1.537 m).   Weight as of this encounter: 78.9 kg.   Code status:   Code Status: Full Code   DVT Prophylaxis: rivaroxaban (XARELTO) tablet 10 mg Start: 06/04/23 1700 rivaroxaban (XARELTO) tablet 10 mg    Family Communication: None at bedside   Disposition Plan: Status is: Inpatient Remains inpatient appropriate because: Severity of illness   Planned Discharge Destination:Home   Diet: Diet Order             Diet Heart Room service appropriate? Yes; Fluid consistency: Thin  Diet effective now                     Antimicrobial agents: Anti-infectives (From admission, onward)    Start     Dose/Rate Route Frequency Ordered Stop  06/05/23 1000  azithromycin (ZITHROMAX) tablet 500 mg        500 mg Oral Daily 06/05/23 0646 06/07/23 0959   06/04/23 2200  hydroxychloroquine (PLAQUENIL) tablet 200 mg        200 mg Oral 2 times daily 06/04/23 1553     06/04/23 1500  cefTRIAXone (ROCEPHIN) 1 g in sodium chloride 0.9 % 100 mL IVPB  Status:  Discontinued        1 g 200 mL/hr over 30 Minutes Intravenous  Once 06/04/23 1448 06/04/23 1459   06/04/23 1500  azithromycin (ZITHROMAX) tablet 500 mg        500 mg Oral   Once 06/04/23 1448 06/04/23 1503   06/04/23 1500  cefTRIAXone (ROCEPHIN) 2 g in sodium chloride 0.9 % 100 mL IVPB        2 g 200 mL/hr over 30 Minutes Intravenous  Once 06/04/23 1459 06/04/23 1535        MEDICATIONS: Scheduled Meds:  acidophilus  1 capsule Oral BID   albuterol  2.5 mg Nebulization TID   azithromycin  500 mg Oral Daily   feeding supplement  237 mL Oral BID BM   guaiFENesin  600 mg Oral BID   hydroxychloroquine  200 mg Oral BID   predniSONE  40 mg Oral Q breakfast   rivaroxaban  10 mg Oral Q supper   sodium chloride flush  3 mL Intravenous Q12H   Continuous Infusions: PRN Meds:.acetaminophen **OR** acetaminophen, albuterol, labetalol   I have personally reviewed following labs and imaging studies  LABORATORY DATA: CBC: Recent Labs  Lab 06/04/23 1039 06/05/23 0436  WBC 7.7 9.5  HGB 10.8* 11.7*  HCT 33.8* 35.7*  MCV 92.3 90.6  PLT 150 198    Basic Metabolic Panel: Recent Labs  Lab 06/04/23 1200 06/05/23 0436  NA 134* 136  K 4.1 3.6  CL 101 106  CO2 18* 20*  GLUCOSE 76 119*  BUN 18 20  CREATININE 0.77 0.75  CALCIUM 8.8* 8.7*    GFR: Estimated Creatinine Clearance: 57.2 mL/min (by C-G formula based on SCr of 0.75 mg/dL).  Liver Function Tests: Recent Labs  Lab 06/04/23 1200  AST 38  ALT 27  ALKPHOS 40  BILITOT 0.8  PROT 6.8  ALBUMIN 3.0*   No results for input(s): "LIPASE", "AMYLASE" in the last 168 hours. No results for input(s): "AMMONIA" in the last 168 hours.  Coagulation Profile: No results for input(s): "INR", "PROTIME" in the last 168 hours.  Cardiac Enzymes: No results for input(s): "CKTOTAL", "CKMB", "CKMBINDEX", "TROPONINI" in the last 168 hours.  BNP (last 3 results) No results for input(s): "PROBNP" in the last 8760 hours.  Lipid Profile: No results for input(s): "CHOL", "HDL", "LDLCALC", "TRIG", "CHOLHDL", "LDLDIRECT" in the last 72 hours.  Thyroid Function Tests: No results for input(s): "TSH", "T4TOTAL",  "FREET4", "T3FREE", "THYROIDAB" in the last 72 hours.  Anemia Panel: No results for input(s): "VITAMINB12", "FOLATE", "FERRITIN", "TIBC", "IRON", "RETICCTPCT" in the last 72 hours.  Urine analysis: No results found for: "COLORURINE", "APPEARANCEUR", "LABSPEC", "PHURINE", "GLUCOSEU", "HGBUR", "BILIRUBINUR", "KETONESUR", "PROTEINUR", "UROBILINOGEN", "NITRITE", "LEUKOCYTESUR"  Sepsis Labs: Lactic Acid, Venous No results found for: "LATICACIDVEN"  MICROBIOLOGY: Recent Results (from the past 240 hour(s))  Resp panel by RT-PCR (RSV, Flu A&B, Covid) Anterior Nasal Swab     Status: None   Collection Time: 06/04/23 10:39 AM   Specimen: Anterior Nasal Swab  Result Value Ref Range Status   SARS Coronavirus 2 by RT PCR NEGATIVE NEGATIVE Final  Influenza A by PCR NEGATIVE NEGATIVE Final   Influenza B by PCR NEGATIVE NEGATIVE Final    Comment: (NOTE) The Xpert Xpress SARS-CoV-2/FLU/RSV plus assay is intended as an aid in the diagnosis of influenza from Nasopharyngeal swab specimens and should not be used as a sole basis for treatment. Nasal washings and aspirates are unacceptable for Xpert Xpress SARS-CoV-2/FLU/RSV testing.  Fact Sheet for Patients: BloggerCourse.com  Fact Sheet for Healthcare Providers: SeriousBroker.it  This test is not yet approved or cleared by the [place] FDA and has been authorized for detection and/or diagnosis of SARS-CoV-2 by FDA under an Emergency Use Authorization (EUA). This EUA will remain in effect (meaning this test can be used) for the duration of the COVID-19 declaration under Section 564(b)(1) of the Act, 21 U.S.C. section 360bbb-3(b)(1), unless the authorization is terminated or revoked.     Resp Syncytial Virus by PCR NEGATIVE NEGATIVE Final    Comment: (NOTE) Fact Sheet for Patients: BloggerCourse.com  Fact Sheet for Healthcare  Providers: SeriousBroker.it  This test is not yet approved or cleared by the [place] FDA and has been authorized for detection and/or diagnosis of SARS-CoV-2 by FDA under an Emergency Use Authorization (EUA). This EUA will remain in effect (meaning this test can be used) for the duration of the COVID-19 declaration under Section 564(b)(1) of the Act, 21 U.S.C. section 360bbb-3(b)(1), unless the authorization is terminated or revoked.  Performed at [facility], [address] [place], [place] [address]     RADIOLOGY STUDIES/RESULTS: CT Angio Chest PE W and/or Wo Contrast  Result Date: 06/04/2023 CLINICAL DATA:  Pulmonary embolism (PE) suspected, high prob. Tachypnea. Dyspnea with exertion. EXAM: CT ANGIOGRAPHY CHEST WITH CONTRAST TECHNIQUE: Multidetector CT imaging of the chest was performed using the standard protocol during bolus administration of intravenous contrast. Multiplanar CT image reconstructions and MIPs were obtained to evaluate the vascular anatomy. RADIATION DOSE REDUCTION: This exam was performed according to the departmental dose-optimization program which includes automated exposure control, adjustment of the mA and/or kV according to patient size and/or use of iterative reconstruction technique. CONTRAST:  75mL OMNIPAQUE IOHEXOL 350 MG/ML SOLN COMPARISON:  CT scan chest from 07/18/2022. FINDINGS: Cardiovascular: No evidence of embolism to the proximal subsegmental pulmonary artery level. Normal cardiac size. No pericardial effusion. No aortic aneurysm. Mild thoracic aortic peripheral vascular calcifications noted. Mediastinum/Nodes: Visualized thyroid gland appears grossly unremarkable. No solid / cystic mediastinal masses. The esophagus is nondistended precluding optimal assessment. Redemonstration of several mildly enlarged mediastinal and bilateral hilar lymph nodes with largest in the subcarinal location measuring up to 1.5 x 2.0 cm.  These are grossly similar to the prior study and favored benign/reactive. No axillary lymphadenopathy by size criteria. Lungs/Pleura: The central tracheo-bronchial tree is patent. There is mild, smooth, circumferential thickening of the segmental and subsegmental bronchial walls, throughout bilateral lungs, which is nonspecific. Findings are most commonly seen with bronchitis or reactive airway disease, such as asthma. Diffuse mild centrilobular emphysematous changes noted with upper lobe predominance. There are atelectatic changes in the bilateral lung lower lobes. No mass or consolidation. No pleural effusion or pneumothorax. No suspicious lung nodules. Upper Abdomen: There is a 5 x 8 mm nonobstructing calculus in the right kidney upper pole. There is a partially exophytic, partially imaged at least 1.9 x 2.4 cm cyst arising from the right kidney lower pole. Remaining visualized upper abdominal viscera within normal limits. Musculoskeletal: The visualized soft tissues of the chest wall are grossly unremarkable. No suspicious osseous lesions.  There are mild multilevel degenerative changes in the visualized spine. There is stable mild-to-moderate anterior wedging deformity of T12 vertebra. Review of the MIP images confirms the above findings. IMPRESSION: *No embolism to the proximal subsegmental pulmonary artery level. *Thickening of the bronchial walls, most commonly seen with bronchitis or reactive airway disease such as asthma. *Multiple other nonacute observations, as described above. Aortic Atherosclerosis (ICD10-I70.0). Electronically Signed   By: Jules Schick M.D.   On: 06/04/2023 17:18   DG Chest Port 1 View  Result Date: 06/04/2023 CLINICAL DATA:  Shortness of breath EXAM: PORTABLE CHEST 1 VIEW COMPARISON:  July 18, 2022 FINDINGS: The cardiomediastinal silhouette is unchanged in contour.Emphysematous changes. No pleural effusion. No pneumothorax. Bibasilar diffuse reticular opacities with bronchial  cuffing. IMPRESSION: Bibasilar diffuse reticular opacities with bronchial cuffing. Findings are favored to reflect bronchitis. Followup PA and lateral chest X-ray is recommended in 4-6 weeks to ensure resolution. Given underlying emphysema, recommend evaluation for candidacy for annual lung cancer screening CT. Electronically Signed   By: Meda Klinefelter M.D.   On: 06/04/2023 11:41     LOS: 1 day   Jeoffrey Massed, MD  [facility]    To contact the attending provider between 7A-7P or the covering provider during after hours 7P-7A, please log into the web site [email or web address] and access using universal [place] password for that web site. If you do not have the password, please call the hospital operator.  06/05/2023, 9:31 AM

## 2023-06-05 NOTE — Progress Notes (Signed)
Mobility Specialist Progress Note;   06/05/23 1500  Mobility  Activity Ambulated with assistance in hallway  Level of Assistance Contact guard assist, steadying assist  Assistive Device Four wheel walker  Distance Ambulated (ft) 150 ft  Activity Response Tolerated well  Mobility Referral Yes  $Mobility charge 1 Mobility  Mobility Specialist Start Time (ACUTE ONLY) 1500  Mobility Specialist Stop Time (ACUTE ONLY) 1520  Mobility Specialist Time Calculation (min) (ACUTE ONLY) 20 min    Pre-mobility: SPO2 93% 5LO2 Post-mobility: SPO2 92% 5LO2  Pt eager for mobility. On 5LO2 upon arrival. Required MinG assistance during ambulation. Ambulated on 6LO2 to stay [facility]. Took 2x standing rest break d/t SOB and displayed wheezing. Unable to receive a good pleth reading however pulseox device displayed SPO2 88%> throughout ambulation. Pt returned back to chair with all needs met, on 5LO2.   Caesar Bookman Mobility Specialist Please contact via SecureChat or Rehab Office [phone]

## 2023-06-05 NOTE — Plan of Care (Signed)
Problem: Clinical Measurements: Goal: Cardiovascular complication will be avoided Outcome: Progressing   Problem: Coping: Goal: Level of anxiety will decrease Outcome: Progressing   Problem: Safety: Goal: Ability to remain free from injury will improve Outcome: Progressing   Problem: Skin Integrity: Goal: Risk for impaired skin integrity will decrease Outcome: Progressing

## 2023-06-05 NOTE — Progress Notes (Signed)
Right lower extremity venous duplex has been completed.  Results can be found in chart review under CV Proc.  06/05/2023 11:11 AM  Fernande Bras, RVT.

## 2023-06-05 NOTE — Discharge Instructions (Signed)
Information on my medicine - XARELTO (rivaroxaban)  This medication education was reviewed with me or my healthcare representative as part of my discharge preparation.  WHY WAS XARELTO PRESCRIBED FOR YOU? Xarelto was prescribed to treat blood clots that may have been found in the veins of your legs (deep vein thrombosis) or in your lungs (pulmonary embolism) and to reduce the risk of them occurring again.  What do you need to know about Xarelto? Continue Xarelto 10mg  tablet taken ONCE A DAY with your evening meal.  DO NOT stop taking Xarelto without talking to the health care provider who prescribed the medication.  Refill your prescription for 20 mg tablets before you run out.  After discharge, you should have regular check-up appointments with your healthcare provider that is prescribing your Xarelto.  In the future your dose may need to be changed if your kidney function changes by a significant amount.  What do you do if you miss a dose? If you are taking Xarelto TWICE DAILY and you miss a dose, take it as soon as you remember. You may take two 15 mg tablets (total 30 mg) at the same time then resume your regularly scheduled 15 mg twice daily the next day.  If you are taking Xarelto ONCE DAILY and you miss a dose, take it as soon as you remember on the same day then continue your regularly scheduled once daily regimen the next day. Do not take two doses of Xarelto at the same time.   Important Safety Information Xarelto is a blood thinner medicine that can cause bleeding. You should call your healthcare provider right away if you experience any of the following: Bleeding from an injury or your nose that does not stop. Unusual colored urine (red or dark brown) or unusual colored stools (red or black). Unusual bruising for unknown reasons. A serious fall or if you hit your head (even if there is no bleeding).  Some medicines may interact with Xarelto and might increase your  risk of bleeding while on Xarelto. To help avoid this, consult your healthcare provider or pharmacist prior to using any new prescription or non-prescription medications, including herbals, vitamins, non-steroidal anti-inflammatory drugs (NSAIDs) and supplements.  This website has more information on Xarelto: VisitDestination.com.br.

## 2023-06-05 NOTE — Evaluation (Signed)
Physical Therapy Evaluation Patient Details Name: Jessica Orr MRN: [id number] DOB: [birth year] Today's Date: 06/05/2023  History of Present Illness  Pt is a 77 y.o. female admitted 11/24 for acute respiratory failure with hypoxia. PMH:  HTN, COPD, lupus on Plaquenil with factor V Leiden deficiency, DVT on anticoagulation of Xarelto, polio, and uterine cancer   Clinical Impression  Pt admitted with above diagnosis. PTA pt lived alone, mod I mobility with cane vs rollator. No home O2 at baseline. Pt currently with functional limitations due to the deficits listed below (see PT Problem List). On eval, pt required supervision bed mobility, CGA transfers, and CGA amb 150' with rollator. SpO2 91% at rest on 4L. Mobilized on 6L with desat to 87%, 3/4 DOE and wheezing. Pt recovered to 91% and returned to 4L in recliner. Pt will benefit from acute skilled PT to increase their independence and safety with mobility to allow discharge. Pt very motivated and eager to participate in therapy. Upon d/c, she would benefit from HHPT. Pt to continue to assess possible need for home O2.          If plan is discharge home, recommend the following: Assistance with cooking/housework;Assist for transportation;Help with stairs or ramp for entrance   Can travel by private vehicle        Equipment Recommendations Other (comment) (assess for home O2)  Recommendations for Other Services       Functional Status Assessment Patient has had a recent decline in their functional status and demonstrates the ability to make significant improvements in function in a reasonable and predictable amount of time.     Precautions / Restrictions Precautions Precautions: Other (comment) Precaution Comments: watch sats Restrictions Weight Bearing Restrictions: No      Mobility  Bed Mobility Overal bed mobility: Needs Assistance Bed Mobility: Supine to Sit     Supine to sit: Supervision, Used rails, HOB elevated           Transfers Overall transfer level: Needs assistance Equipment used: Rollator (4 wheels) Transfers: Sit to/from Stand Sit to Stand: Contact guard assist                Ambulation/Gait Ambulation/Gait assistance: Contact guard assist Gait Distance (Feet): 150 Feet Assistive device: Rollator (4 wheels) Gait Pattern/deviations: Step-through pattern Gait velocity: decreased Gait velocity interpretation: <1.31 ft/sec, indicative of household ambulator   General Gait Details: Amb on 6L with desat to 87%. 3/4 DOE and wheezing noted.  Stairs            Wheelchair Mobility     Tilt Bed    Modified Rankin (Stroke Patients Only)       Balance Overall balance assessment: Needs assistance Sitting-balance support: No upper extremity supported, Feet supported Sitting balance-Leahy Scale: Good     Standing balance support: Single extremity supported, Bilateral upper extremity supported, During functional activity Standing balance-Leahy Scale: Fair                               Pertinent Vitals/Pain Pain Assessment Pain Assessment: No/denies pain    Home Living Family/patient expects to be discharged to:: Private residence Living Arrangements: Alone Available Help at Discharge: Family;Available PRN/intermittently Type of Home: House Home Access: Stairs to enter Entrance Stairs-Rails: Right Entrance Stairs-Number of Steps: 1   Home Layout: One level Home Equipment: Rollator (4 wheels);Cane - single point      Prior Function Prior Level of Function : Independent/Modified  Independent             Mobility Comments: Amb with cane at baseline but has been using rollator for 1 week PTA due to not feeling well ADLs Comments: Pt's son drives her to the grocery store weekly.     Extremity/Trunk Assessment   Upper Extremity Assessment Upper Extremity Assessment: Defer to OT evaluation    Lower Extremity Assessment Lower Extremity  Assessment: Generalized weakness    Cervical / Trunk Assessment Cervical / Trunk Assessment: Kyphotic  Communication   Communication Communication: No apparent difficulties  Cognition Arousal: Alert Behavior During Therapy: WFL for tasks assessed/performed Overall Cognitive Status: Within Functional Limits for tasks assessed                                          General Comments General comments (skin integrity, edema, etc.): SpO2 91% at rest on 4L. Mobilized on 6L with desat to 87%, 3/4 DOE, wheezing.    Exercises     Assessment/Plan    PT Assessment Patient needs continued PT services  PT Problem List Decreased balance;Decreased strength;Decreased mobility;Cardiopulmonary status limiting activity;Decreased activity tolerance       PT Treatment Interventions Functional mobility training;Balance training;Patient/family education;Gait training;Therapeutic activities;Stair training;Therapeutic exercise    PT Goals (Current goals can be found in the Care Plan section)  Acute Rehab PT Goals Patient Stated Goal: home PT Goal Formulation: With patient Time For Goal Achievement: 06/19/23 Potential to Achieve Goals: Good    Frequency Min 1X/week     Co-evaluation               AM-PAC PT "6 Clicks" Mobility  Outcome Measure Help needed turning from your back to your side while in a flat bed without using bedrails?: A Little Help needed moving from lying on your back to sitting on the side of a flat bed without using bedrails?: A Little Help needed moving to and from a bed to a chair (including a wheelchair)?: A Little Help needed standing up from a chair using your arms (e.g., wheelchair or bedside chair)?: A Little Help needed to walk in hospital room?: A Little Help needed climbing 3-5 steps with a railing? : A Lot 6 Click Score: 17    End of Session Equipment Utilized During Treatment: Gait belt;Oxygen Activity Tolerance: Patient tolerated  treatment well Patient left: in chair;with call bell/phone within reach;Other (comment) (OT in room) Nurse Communication: Mobility status PT Visit Diagnosis: Difficulty in walking, not elsewhere classified (R26.2)    Time: 5284-1324 PT Time Calculation (min) (ACUTE ONLY): 24 min   Charges:   PT Evaluation $PT Eval Moderate Complexity: 1 Mod PT Treatments $Gait Training: 8-22 mins PT General Charges $$ ACUTE PT VISIT: 1 Visit         Ferd Glassing., PT  Office # [phone]   Ilda Foil 06/05/2023, 11:37 AM

## 2023-06-05 NOTE — Evaluation (Signed)
Occupational Therapy Evaluation Patient Details Name: Jessica Orr MRN: [id number] DOB: 26-Feb-1946 Today's Date: 06/05/2023   History of Present Illness Pt is a 77 y.o. female admitted 11/24 for acute respiratory failure with hypoxia. PMH:  HTN, COPD, lupus on Plaquenil with factor V Leiden deficiency, DVT on anticoagulation of Xarelto, polio, and uterine cancer   Clinical Impression   Jessica Orr was evaluated s/p the above admission list. She lives alone and is indep at baseline, her son assists with IADLs as needed. Upon evaluation the pt was limited by general malaise, SOB with activity and talking, new supplemental O2 needs and decreased activity tolerance. Overall she needed CGA with rollator for functional mobility with cues for PLB. Due to the deficits listed below the pt also needs up to CGA for OOB ADLs with cues for safety of line O2 management. Pt on 5L [place] with SpO2 at 84-92% throughout; pt demonstrated good ability to complete IS. Pt will benefit from continued acute OT services and discharge home with support of family as needed.        If plan is discharge home, recommend the following: A little help with walking and/or transfers;A little help with bathing/dressing/bathroom;Assistance with cooking/housework;Direct supervision/assist for medications management;Direct supervision/assist for financial management;Assist for transportation;Help with stairs or ramp for entrance    Functional Status Assessment  Patient has had a recent decline in their functional status and demonstrates the ability to make significant improvements in function in a reasonable and predictable amount of time.  Equipment Recommendations  None recommended by OT       Precautions / Restrictions Precautions Precautions: Other (comment) Precaution Comments: watch sats Restrictions Weight Bearing Restrictions: No      Mobility Bed Mobility Overal bed mobility: Needs Assistance              General bed mobility comments: OOB upon arrival    Transfers Overall transfer level: Needs assistance Equipment used: Rollator (4 wheels) Transfers: Sit to/from Stand Sit to Stand: Contact guard assist                  Balance Overall balance assessment: Needs assistance Sitting-balance support: No upper extremity supported, Feet supported Sitting balance-Leahy Scale: Good     Standing balance support: Single extremity supported, Bilateral upper extremity supported, During functional activity Standing balance-Leahy Scale: Fair                             ADL either performed or assessed with clinical judgement   ADL Overall ADL's : Needs assistance/impaired Eating/Feeding: Independent   Grooming: Supervision/safety;Sitting   Upper Body Bathing: Set up;Sitting   Lower Body Bathing: Contact guard assist;Sit to/from stand   Upper Body Dressing : Set up;Sitting   Lower Body Dressing: Contact guard assist;Sit to/from stand   Toilet Transfer: Contact guard assist;Ambulation   Toileting- Clothing Manipulation and Hygiene: Supervision/safety;Sitting/lateral lean       Functional mobility during ADLs: Contact guard assist General ADL Comments: no physical assist needed, CGA for safety only. 5L throughout with SpO2 84-92%     Vision Baseline Vision/History: 0 No visual deficits Vision Assessment?: No apparent visual deficits     Perception Perception: Within Functional Limits       Praxis Praxis: WFL       Pertinent Vitals/Pain Pain Assessment Pain Assessment: No/denies pain     Extremity/Trunk Assessment Upper Extremity Assessment Upper Extremity Assessment: Generalized weakness   Lower Extremity Assessment Lower Extremity  Assessment: Defer to PT evaluation   Cervical / Trunk Assessment Cervical / Trunk Assessment: Kyphotic   Communication Communication Communication: No apparent difficulties   Cognition Arousal: Alert Behavior  During Therapy: WFL for tasks assessed/performed Overall Cognitive Status: Within Functional Limits for tasks assessed         General Comments  SpO2 84/92% on 5L throughout            Home Living Family/patient expects to be discharged to:: Private residence Living Arrangements: Alone Available Help at Discharge: Family;Available PRN/intermittently Type of Home: House Home Access: Stairs to enter Entrance Stairs-Number of Steps: 1 Entrance Stairs-Rails: Right Home Layout: One level     Bathroom Shower/Tub: Producer, television/film/video: Handicapped height     Home Equipment: Rollator (4 wheels);Cane - single point;Grab bars - tub/shower          Prior Functioning/Environment Prior Level of Function : Independent/Modified Independent             Mobility Comments: Amb with cane at baseline but has been using rollator for 1 week PTA due to not feeling well ADLs Comments: pt does not drive, family takes her where she needs to go. otherwise indep for IADLs        OT Problem List: Decreased range of motion;Decreased activity tolerance;Impaired balance (sitting and/or standing);Decreased knowledge of use of DME or AE      OT Treatment/Interventions: Self-care/ADL training;Energy conservation;DME and/or AE instruction;Patient/family education    OT Goals(Current goals can be found in the care plan section) Acute Rehab OT Goals Patient Stated Goal: to feel better OT Goal Formulation: With patient Time For Goal Achievement: 06/19/23 Potential to Achieve Goals: Good ADL Goals Additional ADL Goal #1: Pt will complete all BADLs with mod I minding O2 tubing for safety Additional ADL Goal #2: Pt will indep recall at least 3 energy conservation strategies to apply in the home settings  OT Frequency: Min 1X/week       AM-PAC OT "6 Clicks" Daily Activity     Outcome Measure Help from another person eating meals?: None Help from another person taking care of  personal grooming?: A Little Help from another person toileting, which includes using toliet, bedpan, or urinal?: A Little Help from another person bathing (including washing, rinsing, drying)?: A Little Help from another person to put on and taking off regular upper body clothing?: A Little Help from another person to put on and taking off regular lower body clothing?: A Little 6 Click Score: 19   End of Session Equipment Utilized During Treatment: Rollator (4 wheels);Oxygen Nurse Communication: Mobility status  Activity Tolerance: Patient tolerated treatment well Patient left: in chair;with call bell/phone within reach  OT Visit Diagnosis: Unsteadiness on feet (R26.81);Other abnormalities of gait and mobility (R26.89);Muscle weakness (generalized) (M62.81)                Time: 1610-9604 OT Time Calculation (min): 21 min Charges:  OT General Charges $OT Visit: 1 Visit OT Evaluation $OT Eval Moderate Complexity: 1 Mod  Derenda Mis, OTR/L Acute Rehabilitation Services Office [phone] Secure Chat Communication Preferred   Donia Pounds 06/05/2023, 12:07 PM

## 2023-06-06 DIAGNOSIS — D682 Hereditary deficiency of other clotting factors: Secondary | ICD-10-CM | POA: Diagnosis not present

## 2023-06-06 DIAGNOSIS — Z86718 Personal history of other venous thrombosis and embolism: Secondary | ICD-10-CM | POA: Diagnosis not present

## 2023-06-06 DIAGNOSIS — J40 Bronchitis, not specified as acute or chronic: Secondary | ICD-10-CM | POA: Diagnosis not present

## 2023-06-06 DIAGNOSIS — J9601 Acute respiratory failure with hypoxia: Secondary | ICD-10-CM | POA: Diagnosis not present

## 2023-06-06 MED ORDER — REVEFENACIN 175 MCG/3ML IN SOLN
175.0000 ug | Freq: Every day | RESPIRATORY_TRACT | Status: DC
  Administered 2023-06-06 – 2023-06-08 (×3): 175 ug via RESPIRATORY_TRACT
  Filled 2023-06-06 (×3): qty 3

## 2023-06-06 MED ORDER — ARFORMOTEROL TARTRATE 15 MCG/2ML IN NEBU
15.0000 ug | INHALATION_SOLUTION | Freq: Two times a day (BID) | RESPIRATORY_TRACT | Status: DC
  Administered 2023-06-06 – 2023-06-08 (×5): 15 ug via RESPIRATORY_TRACT
  Filled 2023-06-06 (×5): qty 2

## 2023-06-06 MED ORDER — BUDESONIDE 0.25 MG/2ML IN SUSP
0.2500 mg | Freq: Two times a day (BID) | RESPIRATORY_TRACT | Status: DC
  Administered 2023-06-06 – 2023-06-07 (×3): 0.25 mg via RESPIRATORY_TRACT
  Filled 2023-06-06 (×4): qty 2

## 2023-06-06 NOTE — Progress Notes (Signed)
Physical Therapy Treatment Patient Details Name: Jessica Orr MRN: [id number] DOB: Oct 31, 1945 Today's Date: 06/06/2023   History of Present Illness Pt is a 77 y.o. female admitted 11/24 for acute respiratory failure with hypoxia. PMH:  HTN, COPD, lupus on Plaquenil with factor V Leiden deficiency, DVT on anticoagulation of Xarelto, polio, and uterine cancer    PT Comments  Pt received sitting on BSC and agreeable to session. Pt able to tolerate gait trial on 3L with SpO2 remaining WFL, however pt continues to be limited by fatigue and DOE. Pt able to complete x5 STS with short rest breaks between due to increased fatigue. Pt continues to benefit from PT services to progress toward functional mobility goals.    If plan is discharge home, recommend the following: Assistance with cooking/housework;Assist for transportation;Help with stairs or ramp for entrance   Can travel by private vehicle        Equipment Recommendations       Recommendations for Other Services       Precautions / Restrictions Precautions Precautions: Other (comment) Precaution Comments: watch sats Restrictions Weight Bearing Restrictions: No     Mobility  Bed Mobility               General bed mobility comments: OOB upon arrival    Transfers Overall transfer level: Needs assistance Equipment used: Rollator (4 wheels), None Transfers: Sit to/from Stand Sit to Stand: Supervision           General transfer comment: from recliner with and without UE support for power up    Ambulation/Gait Ambulation/Gait assistance: Contact guard assist Gait Distance (Feet): 150 Feet Assistive device: Rollator (4 wheels) Gait Pattern/deviations: Step-through pattern, Trunk flexed       General Gait Details: slow gait with mild unsteadiness with rollator support. DOE with progressed distance      Balance Overall balance assessment: Needs assistance Sitting-balance support: No upper extremity  supported, Feet supported Sitting balance-Leahy Scale: Good     Standing balance support: Bilateral upper extremity supported, During functional activity Standing balance-Leahy Scale: Fair Standing balance comment: with rollator support                            Cognition Arousal: Alert Behavior During Therapy: WFL for tasks assessed/performed Overall Cognitive Status: Within Functional Limits for tasks assessed                                          Exercises Other Exercises Other Exercises: x5 serial STS with BUE progressing to no UE support    General Comments General comments (skin integrity, edema, etc.): Pt on 3L throughout session with SpO2 >90%, however noted poor pleth during ambulation.      Pertinent Vitals/Pain Pain Assessment Pain Assessment: No/denies pain     PT Goals (current goals can now be found in the care plan section) Acute Rehab PT Goals Patient Stated Goal: home PT Goal Formulation: With patient Time For Goal Achievement: 06/19/23 Progress towards PT goals: Progressing toward goals    Frequency    Min 1X/week       AM-PAC PT "6 Clicks" Mobility   Outcome Measure  Help needed turning from your back to your side while in a flat bed without using bedrails?: A Little Help needed moving from lying on your back to sitting on the side  of a flat bed without using bedrails?: A Little Help needed moving to and from a bed to a chair (including a wheelchair)?: A Little Help needed standing up from a chair using your arms (e.g., wheelchair or bedside chair)?: A Little Help needed to walk in hospital room?: A Little Help needed climbing 3-5 steps with a railing? : A Lot 6 Click Score: 17    End of Session Equipment Utilized During Treatment: Gait belt;Oxygen Activity Tolerance: Patient tolerated treatment well;Patient limited by fatigue Patient left: in chair;with call bell/phone within reach;Other (comment) Nurse  Communication: Mobility status PT Visit Diagnosis: Difficulty in walking, not elsewhere classified (R26.2)     Time: 1322-1350 PT Time Calculation (min) (ACUTE ONLY): 28 min  Charges:    $Gait Training: 8-22 mins $Therapeutic Activity: 8-22 mins PT General Charges $$ ACUTE PT VISIT: 1 Visit                    Johny Shock, PTA Acute Rehabilitation Services Secure Chat Preferred  [phone]    Johny Shock 06/06/2023, 2:01 PM

## 2023-06-06 NOTE — Plan of Care (Signed)
Problem: Clinical Measurements: Goal: Will remain free from infection Outcome: Progressing Goal: Cardiovascular complication will be avoided Outcome: Progressing   Problem: Coping: Goal: Level of anxiety will decrease Outcome: Progressing   Problem: Elimination: Goal: Will not experience complications related to bowel motility Outcome: Progressing

## 2023-06-06 NOTE — Plan of Care (Signed)
Pt rested quietly throughout the night with no distress noted. Alert and oriented. On room air. SR on the monitor. Up to [facility] to void. Had one large BM. Continues to have expiratory wheezes. No complaints voiced.    Problem: Education: Goal: Knowledge of General Education information will improve Description: Including pain rating scale, medication(s)/side effects and non-pharmacologic comfort measures Outcome: Progressing   Problem: Clinical Measurements: Goal: Ability to maintain clinical measurements within normal limits will improve Outcome: Progressing Goal: Respiratory complications will improve Outcome: Progressing   Problem: Activity: Goal: Risk for activity intolerance will decrease Outcome: Progressing   Problem: Elimination: Goal: Will not experience complications related to bowel motility Outcome: Progressing

## 2023-06-06 NOTE — Progress Notes (Signed)
PROGRESS NOTE        PATIENT DETAILS Name: Jessica Orr Age: 77 y.o. Sex: female Date of Birth: 08-14-1945 Admit Date: 06/04/2023 Admitting Physician Clydie Braun, MD WUJ:[id number], Fleet Contras, MD  Brief Summary: Patient is a 77 y.o.  female with history of recurrent DVT secondary to factor V Leiden mutation on Xarelto, COPD, HTN-presented with cough/shortness of breath for approximately 4-5 days prior to this hospitalization-she was found to have acute bronchitis causing COPD exacerbation and subsequently admitted to the hospitalist service.  Significant events: 11/24>> admit to [facility]  Significant studies: 11/24>> CTA chest: No PE, thickening of bronchial walls-seen in bronchitis 11/25>> bilateral lower extremity Doppler: No DVT  Significant microbiology data: 11/24>> COVID/influenza/RSV PCR: Negative  Procedures: None  Consults: None  Subjective: No issues overnight-feels better than yesterday-breathing is better-but still not at baseline.    Objective: Vitals: Blood pressure (!) 106/49, pulse (!) 56, temperature 98.1 F (36.7 C), temperature source Oral, resp. rate 20, height 5' 0.5" (1.537 m), weight 78.9 kg, SpO2 92%.   Exam: Gen Exam:Alert awake-not in any distress HEENT:atraumatic, normocephalic Chest: Moving air well bilaterally-some scattered rhonchi. CVS:S1S2 regular Abdomen:soft non tender, non distended Extremities:no edema Neurology: Non focal Skin: no rash  Pertinent Labs/Radiology:    Latest Ref Rng & Units 06/05/2023    4:36 AM 06/04/2023   10:39 AM 08/21/2019   11:07 AM  CBC  WBC 4.0 - 10.5 K/uL 9.5  7.7  6.3   Hemoglobin 12.0 - 15.0 g/dL 78.2  95.6  21.3   Hematocrit 36.0 - 46.0 % 35.7  33.8  41.1   Platelets 150 - 400 K/uL 198  150  229     Lab Results  Component Value Date   NA 136 06/05/2023   K 3.6 06/05/2023   CL 106 06/05/2023   CO2 20 (L) 06/05/2023      Assessment/Plan: Acute hypoxic respiratory  failure secondary to acute bronchitis with COPD exacerbation Overall improved-moving air well-only a few scattered rhonchi Still on 3 L of oxygen this morning-will need to be titrated down Continue steroids/bronchodilators Zithromax x 3 days total Continue to mobilize Pulmonary toileting If improvement continues-likely home on 11/27  History of VTE-factor V Leiden mutation CTA chest negative for PE Lower extremity Dopplers negative Continue Xarelto  History of lupus Plaquenil  HTN BP stable-without the use of any antihypertensives  Debility/deconditioning Prior history of polio PT/OT eval  Exophytic mass arising from right kidney Seen incidentally on CTA chest Further workup/imaging can be deferred to the outpatient-when she is more stable.  Obesity: Estimated body mass index is 33.41 kg/m as calculated from the following:   Height as of this encounter: 5' 0.5" (1.537 m).   Weight as of this encounter: 78.9 kg.   Code status:   Code Status: Full Code   DVT Prophylaxis: rivaroxaban (XARELTO) tablet 10 mg Start: 06/04/23 1700 rivaroxaban (XARELTO) tablet 10 mg    Family Communication: None at bedside   Disposition Plan: Status is: Inpatient Remains inpatient appropriate because: Severity of illness   Planned Discharge Destination:Home   Diet: Diet Order             Diet Heart Room service appropriate? Yes; Fluid consistency: Thin  Diet effective now  Antimicrobial agents: Anti-infectives (From admission, onward)    Start     Dose/Rate Route Frequency Ordered Stop   06/05/23 1000  azithromycin (ZITHROMAX) tablet 500 mg        500 mg Oral Daily 06/05/23 0646 06/07/23 0959   06/04/23 2200  hydroxychloroquine (PLAQUENIL) tablet 200 mg        200 mg Oral 2 times daily 06/04/23 1553     06/04/23 1500  cefTRIAXone (ROCEPHIN) 1 g in sodium chloride 0.9 % 100 mL IVPB  Status:  Discontinued        1 g 200 mL/hr over 30 Minutes  Intravenous  Once 06/04/23 1448 06/04/23 1459   06/04/23 1500  azithromycin (ZITHROMAX) tablet 500 mg        500 mg Oral  Once 06/04/23 1448 06/04/23 1503   06/04/23 1500  cefTRIAXone (ROCEPHIN) 2 g in sodium chloride 0.9 % 100 mL IVPB        2 g 200 mL/hr over 30 Minutes Intravenous  Once 06/04/23 1459 06/04/23 1535        MEDICATIONS: Scheduled Meds:  acidophilus  1 capsule Oral BID   albuterol  2.5 mg Nebulization TID   azithromycin  500 mg Oral Daily   feeding supplement  237 mL Oral BID BM   guaiFENesin  600 mg Oral BID   hydroxychloroquine  200 mg Oral BID   predniSONE  40 mg Oral Q breakfast   rivaroxaban  10 mg Oral Q supper   sodium chloride flush  3 mL Intravenous Q12H   Continuous Infusions: PRN Meds:.acetaminophen **OR** acetaminophen, albuterol, benzonatate, labetalol   I have personally reviewed following labs and imaging studies  LABORATORY DATA: CBC: Recent Labs  Lab 06/04/23 1039 06/05/23 0436  WBC 7.7 9.5  HGB 10.8* 11.7*  HCT 33.8* 35.7*  MCV 92.3 90.6  PLT 150 198    Basic Metabolic Panel: Recent Labs  Lab 06/04/23 1200 06/05/23 0436  NA 134* 136  K 4.1 3.6  CL 101 106  CO2 18* 20*  GLUCOSE 76 119*  BUN 18 20  CREATININE 0.77 0.75  CALCIUM 8.8* 8.7*    GFR: Estimated Creatinine Clearance: 57.2 mL/min (by C-G formula based on SCr of 0.75 mg/dL).  Liver Function Tests: Recent Labs  Lab 06/04/23 1200  AST 38  ALT 27  ALKPHOS 40  BILITOT 0.8  PROT 6.8  ALBUMIN 3.0*   No results for input(s): "LIPASE", "AMYLASE" in the last 168 hours. No results for input(s): "AMMONIA" in the last 168 hours.  Coagulation Profile: No results for input(s): "INR", "PROTIME" in the last 168 hours.  Cardiac Enzymes: No results for input(s): "CKTOTAL", "CKMB", "CKMBINDEX", "TROPONINI" in the last 168 hours.  BNP (last 3 results) No results for input(s): "PROBNP" in the last 8760 hours.  Lipid Profile: No results for input(s): "CHOL", "HDL",  "LDLCALC", "TRIG", "CHOLHDL", "LDLDIRECT" in the last 72 hours.  Thyroid Function Tests: No results for input(s): "TSH", "T4TOTAL", "FREET4", "T3FREE", "THYROIDAB" in the last 72 hours.  Anemia Panel: No results for input(s): "VITAMINB12", "FOLATE", "FERRITIN", "TIBC", "IRON", "RETICCTPCT" in the last 72 hours.  Urine analysis: No results found for: "COLORURINE", "APPEARANCEUR", "LABSPEC", "PHURINE", "GLUCOSEU", "HGBUR", "BILIRUBINUR", "KETONESUR", "PROTEINUR", "UROBILINOGEN", "NITRITE", "LEUKOCYTESUR"  Sepsis Labs: Lactic Acid, Venous No results found for: "LATICACIDVEN"  MICROBIOLOGY: Recent Results (from the past 240 hour(s))  Resp panel by RT-PCR (RSV, Flu A&B, Covid) Anterior Nasal Swab     Status: None   Collection Time: 06/04/23 10:39 AM  Specimen: Anterior Nasal Swab  Result Value Ref Range Status   SARS Coronavirus 2 by RT PCR NEGATIVE NEGATIVE Final   Influenza A by PCR NEGATIVE NEGATIVE Final   Influenza B by PCR NEGATIVE NEGATIVE Final    Comment: (NOTE) The Xpert Xpress SARS-CoV-2/FLU/RSV plus assay is intended as an aid in the diagnosis of influenza from Nasopharyngeal swab specimens and should not be used as a sole basis for treatment. Nasal washings and aspirates are unacceptable for Xpert Xpress SARS-CoV-2/FLU/RSV testing.  Fact Sheet for Patients: BloggerCourse.com  Fact Sheet for Healthcare Providers: SeriousBroker.it  This test is not yet approved or cleared by the [place] FDA and has been authorized for detection and/or diagnosis of SARS-CoV-2 by FDA under an Emergency Use Authorization (EUA). This EUA will remain in effect (meaning this test can be used) for the duration of the COVID-19 declaration under Section 564(b)(1) of the Act, 21 U.S.C. section 360bbb-3(b)(1), unless the authorization is terminated or revoked.     Resp Syncytial Virus by PCR NEGATIVE NEGATIVE Final    Comment:  (NOTE) Fact Sheet for Patients: BloggerCourse.com  Fact Sheet for Healthcare Providers: SeriousBroker.it  This test is not yet approved or cleared by the [place] FDA and has been authorized for detection and/or diagnosis of SARS-CoV-2 by FDA under an Emergency Use Authorization (EUA). This EUA will remain in effect (meaning this test can be used) for the duration of the COVID-19 declaration under Section 564(b)(1) of the Act, 21 U.S.C. section 360bbb-3(b)(1), unless the authorization is terminated or revoked.  Performed at [facility], [address], [place], [place] [address]     RADIOLOGY STUDIES/RESULTS: VAS [place] LOWER EXTREMITY VENOUS (DVT)  Result Date: 06/06/2023  Lower Venous DVT Study Patient Name:  BRINKLEY MARQUISS Althouse  Date of Exam:   06/05/2023 Medical Rec #: [id number]            Accession #:    [id number] Date of Birth: [birth year]            Patient Gender: F Patient Age:   7 years Exam Location:  [facility] Procedure:      VAS [place] LOWER EXTREMITY VENOUS (DVT) Referring Phys: Madelyn Flavors --------------------------------------------------------------------------------  Indications: Pain, and SOB.  Comparison Study: No prior exam. Performing Technologist: Fernande Bras  Examination Guidelines: A complete evaluation includes B-mode imaging, spectral Doppler, color Doppler, and power Doppler as needed of all accessible portions of each vessel. Bilateral testing is considered an integral part of a complete examination. Limited examinations for reoccurring indications may be performed as noted. The reflux portion of the exam is performed with the patient in reverse Trendelenburg.  +---------+---------------+---------+-----------+----------+--------------+ RIGHT    CompressibilityPhasicitySpontaneityPropertiesThrombus Aging +---------+---------------+---------+-----------+----------+--------------+ CFV       Full           Yes      Yes                                 +---------+---------------+---------+-----------+----------+--------------+ SFJ      Full                                                        +---------+---------------+---------+-----------+----------+--------------+ FV Prox  Full                                                        +---------+---------------+---------+-----------+----------+--------------+  FV Mid   Full                                                        +---------+---------------+---------+-----------+----------+--------------+ FV DistalFull                                                        +---------+---------------+---------+-----------+----------+--------------+ PFV      Full                                                        +---------+---------------+---------+-----------+----------+--------------+ POP      Full           Yes      Yes                                 +---------+---------------+---------+-----------+----------+--------------+ PTV      Full                                                        +---------+---------------+---------+-----------+----------+--------------+ PERO     Full                                                        +---------+---------------+---------+-----------+----------+--------------+   +----+---------------+---------+-----------+----------+--------------+ LEFTCompressibilityPhasicitySpontaneityPropertiesThrombus Aging +----+---------------+---------+-----------+----------+--------------+ CFV Full           Yes      Yes                                 +----+---------------+---------+-----------+----------+--------------+     Summary: RIGHT: - There is no evidence of deep vein thrombosis in the lower extremity.  - No cystic structure found in the popliteal fossa.  LEFT: - No evidence of common femoral vein obstruction.   *See table(s) above for  measurements and observations. Electronically signed by Carolynn Sayers on 06/06/2023 at 7:34:07 AM.    Final    CT Angio Chest PE W and/or Wo Contrast  Result Date: 06/04/2023 CLINICAL DATA:  Pulmonary embolism (PE) suspected, high prob. Tachypnea. Dyspnea with exertion. EXAM: CT ANGIOGRAPHY CHEST WITH CONTRAST TECHNIQUE: Multidetector CT imaging of the chest was performed using the standard protocol during bolus administration of intravenous contrast. Multiplanar CT image reconstructions and MIPs were obtained to evaluate the vascular anatomy. RADIATION DOSE REDUCTION: This exam was performed according to the departmental dose-optimization program which includes automated exposure control, adjustment of the mA and/or kV according to patient size and/or use of iterative reconstruction technique. CONTRAST:  75mL OMNIPAQUE IOHEXOL 350 MG/ML SOLN COMPARISON:  CT scan chest from 07/18/2022.  FINDINGS: Cardiovascular: No evidence of embolism to the proximal subsegmental pulmonary artery level. Normal cardiac size. No pericardial effusion. No aortic aneurysm. Mild thoracic aortic peripheral vascular calcifications noted. Mediastinum/Nodes: Visualized thyroid gland appears grossly unremarkable. No solid / cystic mediastinal masses. The esophagus is nondistended precluding optimal assessment. Redemonstration of several mildly enlarged mediastinal and bilateral hilar lymph nodes with largest in the subcarinal location measuring up to 1.5 x 2.0 cm. These are grossly similar to the prior study and favored benign/reactive. No axillary lymphadenopathy by size criteria. Lungs/Pleura: The central tracheo-bronchial tree is patent. There is mild, smooth, circumferential thickening of the segmental and subsegmental bronchial walls, throughout bilateral lungs, which is nonspecific. Findings are most commonly seen with bronchitis or reactive airway disease, such as asthma. Diffuse mild centrilobular emphysematous changes noted with  upper lobe predominance. There are atelectatic changes in the bilateral lung lower lobes. No mass or consolidation. No pleural effusion or pneumothorax. No suspicious lung nodules. Upper Abdomen: There is a 5 x 8 mm nonobstructing calculus in the right kidney upper pole. There is a partially exophytic, partially imaged at least 1.9 x 2.4 cm cyst arising from the right kidney lower pole. Remaining visualized upper abdominal viscera within normal limits. Musculoskeletal: The visualized soft tissues of the chest wall are grossly unremarkable. No suspicious osseous lesions. There are mild multilevel degenerative changes in the visualized spine. There is stable mild-to-moderate anterior wedging deformity of T12 vertebra. Review of the MIP images confirms the above findings. IMPRESSION: *No embolism to the proximal subsegmental pulmonary artery level. *Thickening of the bronchial walls, most commonly seen with bronchitis or reactive airway disease such as asthma. *Multiple other nonacute observations, as described above. Aortic Atherosclerosis (ICD10-I70.0). Electronically Signed   By: Jules Schick M.D.   On: 06/04/2023 17:18   DG Chest Port 1 View  Result Date: 06/04/2023 CLINICAL DATA:  Shortness of breath EXAM: PORTABLE CHEST 1 VIEW COMPARISON:  July 18, 2022 FINDINGS: The cardiomediastinal silhouette is unchanged in contour.Emphysematous changes. No pleural effusion. No pneumothorax. Bibasilar diffuse reticular opacities with bronchial cuffing. IMPRESSION: Bibasilar diffuse reticular opacities with bronchial cuffing. Findings are favored to reflect bronchitis. Followup PA and lateral chest X-ray is recommended in 4-6 weeks to ensure resolution. Given underlying emphysema, recommend evaluation for candidacy for annual lung cancer screening CT. Electronically Signed   By: Meda Klinefelter M.D.   On: 06/04/2023 11:41     LOS: 2 days   Jeoffrey Massed, MD  [facility]    To contact the attending  provider between 7A-7P or the covering provider during after hours 7P-7A, please log into the web site [email or web address] and access using universal [place] password for that web site. If you do not have the password, please call the hospital operator.  06/06/2023, 8:54 AM

## 2023-06-06 NOTE — TOC Progression Note (Signed)
Transition of Care [facility]) - Progression Note    Patient Details  Name: Jessica Orr MRN: [id number] Date of Birth: February 23, 1946  [facility] CM/SW Contact  Gordy Clement, RN Phone Number: 06/06/2023, 12:51 PM  Clinical Narrative:  CM met with patient bedside to discuss dc plan. Patient states she wants to return home and may go to her Daughters house for a few days intiially. HHPT has been recommended and will be provided by [facility].   TOC will continue to follow patient for any additional discharge needs             Expected Discharge Plan and Services                                               Social Determinants of Health (SDOH) Interventions SDOH Screenings   Food Insecurity: No Food Insecurity (06/04/2023)  Housing: Low Risk  (06/04/2023)  Transportation Needs: No Transportation Needs (06/04/2023)  Utilities: Not At Risk (06/04/2023)  Tobacco Use: Medium Risk (06/04/2023)    Readmission Risk Interventions     No data to display

## 2023-06-07 DIAGNOSIS — Z86718 Personal history of other venous thrombosis and embolism: Secondary | ICD-10-CM | POA: Diagnosis not present

## 2023-06-07 DIAGNOSIS — D682 Hereditary deficiency of other clotting factors: Secondary | ICD-10-CM | POA: Diagnosis not present

## 2023-06-07 DIAGNOSIS — J9601 Acute respiratory failure with hypoxia: Secondary | ICD-10-CM | POA: Diagnosis not present

## 2023-06-07 DIAGNOSIS — J40 Bronchitis, not specified as acute or chronic: Secondary | ICD-10-CM | POA: Diagnosis not present

## 2023-06-07 MED ORDER — LORATADINE 10 MG PO TABS
10.0000 mg | ORAL_TABLET | Freq: Every day | ORAL | Status: DC
  Administered 2023-06-07 – 2023-06-08 (×2): 10 mg via ORAL
  Filled 2023-06-07 (×2): qty 1

## 2023-06-07 MED ORDER — FUROSEMIDE 10 MG/ML IJ SOLN
20.0000 mg | Freq: Once | INTRAMUSCULAR | Status: AC
  Administered 2023-06-07: 20 mg via INTRAVENOUS
  Filled 2023-06-07: qty 2

## 2023-06-07 MED ORDER — FLUTICASONE PROPIONATE 50 MCG/ACT NA SUSP
2.0000 | Freq: Every day | NASAL | Status: DC
  Administered 2023-06-07 – 2023-06-08 (×2): 2 via NASAL
  Filled 2023-06-07: qty 16

## 2023-06-07 MED ORDER — IPRATROPIUM-ALBUTEROL 0.5-2.5 (3) MG/3ML IN SOLN
3.0000 mL | Freq: Three times a day (TID) | RESPIRATORY_TRACT | Status: DC
  Administered 2023-06-07 – 2023-06-08 (×4): 3 mL via RESPIRATORY_TRACT
  Filled 2023-06-07 (×4): qty 3

## 2023-06-07 MED ORDER — PREDNISONE 5 MG PO TABS
30.0000 mg | ORAL_TABLET | Freq: Every day | ORAL | Status: DC
  Administered 2023-06-07 – 2023-06-08 (×2): 30 mg via ORAL
  Filled 2023-06-07 (×2): qty 2

## 2023-06-07 MED ORDER — LOPERAMIDE HCL 2 MG PO CAPS
2.0000 mg | ORAL_CAPSULE | ORAL | Status: DC | PRN
  Administered 2023-06-07: 2 mg via ORAL
  Filled 2023-06-07: qty 1

## 2023-06-07 NOTE — Discharge Summary (Incomplete)
PATIENT DETAILS Name: Jessica Orr Age: 77 y.o. Sex: female Date of Birth: 04-May-1946 MRN: [id number]. Admitting Physician: Clydie Braun, MD NWG:[id number], Fleet Contras, MD  Admit Date: 06/04/2023 Discharge date: 06/08/2023  Recommendations for Outpatient Follow-up:  Follow up with PCP in 1-2 weeks Please obtain CMP/CBC in one week Needs dedicated renal imaging-incidental finding of a possible exophytic mass arising from her right kidney seen on CTA chest. Reassess if patient requires home O2 at next visit.  Admitted From:  Home  Disposition: Home health   Discharge Condition: good  CODE STATUS:   Code Status: Full Code   Diet recommendation:  Diet Order             Diet - low sodium heart healthy           Diet Heart Room service appropriate? Yes; Fluid consistency: Thin  Diet effective now                    Brief Summary: Patient is a 77 y.o.  female with history of recurrent DVT secondary to factor V Leiden mutation on Xarelto, COPD, HTN-presented with cough/shortness of breath for approximately 4-5 days prior to this hospitalization-she was found to have acute bronchitis causing COPD exacerbation and subsequently admitted to the hospitalist service.   Significant events: 11/24>> admit to [facility]   Significant studies: 11/24>> CTA chest: No PE, thickening of bronchial walls-seen in bronchitis 11/25>> bilateral lower extremity Doppler: No DVT   Significant microbiology data: 11/24>> COVID/influenza/RSV PCR: Negative   Procedures: None   Consults: None  Brief Hospital Course: Acute hypoxic respiratory failure secondary to acute bronchitis with COPD exacerbation Overall improved-moving air well-still with a few scattered rhonchi Has been titrated to room air Treated with steroids/bronchodilators and 3 days of Zithromax Since feels better-she is actually requesting discharge today-and wants to go home for the holiday weekend. Continue tapering  steroids-she is not on an inhaler regimen prior to this hospitalization-will place her on maintenance/rescue inhaler regimen, she does qualify for home O2 which will be arranged-she will need close outpatient follow-up with her PCP.   If not documented before-she will need formal PFTs and PCCM referral as an outpatient.   History of VTE-factor V Leiden mutation CTA chest negative for PE Lower extremity Dopplers negative Continue Xarelto   History of lupus Plaquenil   HTN BP stable-without the use of any antihypertensives   Debility/deconditioning Prior history of polio PT/OT eval-recommendations are for home health.   Exophytic mass arising from right kidney Seen incidentally on CTA chest Further workup/imaging can be deferred to the outpatient-when she is more stable. Patient aware of this finding and recommendation   Obesity: Estimated body mass index is 33.41 kg/m as calculated from the following:   Height as of this encounter: 5' 0.5" (1.537 m).   Weight as of this encounter: 78.9 kg  Note-son updated on the day of discharge.  He is aware of his mother's request to go home today.  Discharge Diagnoses:  Principal Problem:   Acute respiratory failure with hypoxia (HCC) Active Problems:   Bronchitis   Factor V deficiency (HCC)   History of DVT (deep vein thrombosis)   Lupus (systemic lupus erythematosus) (HCC)   Hypertension   Obesity (BMI 30-39.9)   Discharge Instructions:  Activity:  As tolerated with Full fall precautions use walker/cane & assistance as needed  Discharge Instructions     Call MD for:  difficulty breathing, headache or visual disturbances   Complete  by: As directed    Diet - low sodium heart healthy   Complete by: As directed    Discharge instructions   Complete by: As directed    Follow with Primary MD  Aliene Beams, MD in 1-2 weeks  Incidental finding-you had a exophytic mass coming out of your right kidney-your primary care  practitioner will need to do a dedicated imaging of your kidney-to assess this issue.  In some cases these masses can turn cancerous.  Please get a complete blood count and chemistry panel checked by your Primary MD at your next visit, and again as instructed by your Primary MD.  Get Medicines reviewed and adjusted: Please take all your medications with you for your next visit with your Primary MD  Laboratory/radiological data: Please request your Primary MD to go over all hospital tests and procedure/radiological results at the follow up, please ask your Primary MD to get all Hospital records sent to his/her office.  In some cases, they will be blood work, cultures and biopsy results pending at the time of your discharge. Please request that your primary care M.D. follows up on these results.  Also Note the following: If you experience worsening of your admission symptoms, develop shortness of breath, life threatening emergency, suicidal or homicidal thoughts you must seek medical attention immediately by calling 911 or calling your MD immediately  if symptoms less severe.  You must read complete instructions/literature along with all the possible adverse reactions/side effects for all the Medicines you take and that have been prescribed to you. Take any new Medicines after you have completely understood and accpet all the possible adverse reactions/side effects.   Do not drive when taking Pain medications or sleeping medications (Benzodaizepines)  Do not take more than prescribed Pain, Sleep and Anxiety Medications. It is not advisable to combine anxiety,sleep and pain medications without talking with your primary care practitioner  Special Instructions: If you have smoked or chewed Tobacco  in the last 2 yrs please stop smoking, stop any regular Alcohol  and or any Recreational drug use.  Wear Seat belts while driving.  Please note: You were cared for by a hospitalist during your  hospital stay. Once you are discharged, your primary care physician will handle any further medical issues. Please note that NO REFILLS for any discharge medications will be authorized once you are discharged, as it is imperative that you return to your primary care physician (or establish a relationship with a primary care physician if you do not have one) for your post hospital discharge needs so that they can reassess your need for medications and monitor your lab values.   Increase activity slowly   Complete by: As directed       Allergies as of 06/08/2023       Reactions   Ciprofloxacin Anaphylaxis   unknown        Medication List     TAKE these medications    acetaminophen 500 MG tablet Commonly known as: TYLENOL Take 500 mg by mouth every 6 (six) hours as needed.   alendronate 70 MG tablet Commonly known as: FOSAMAX Take 70 mg by mouth once a week.   atorvastatin 10 MG tablet Commonly known as: LIPITOR Take 10 mg by mouth daily. Take one at bedtime   benzonatate 200 MG capsule Commonly known as: TESSALON Take 1 capsule (200 mg total) by mouth 3 (three) times daily as needed for cough.   fluticasone 50 MCG/ACT nasal spray Commonly known  as: FLONASE Place 2 sprays into both nostrils daily.   fluticasone-salmeterol 250-50 MCG/ACT Aepb Commonly known as: Advair Diskus Inhale 1 puff into the lungs in the morning and at bedtime.   guaiFENesin 600 MG 12 hr tablet Commonly known as: MUCINEX Take 1 tablet (600 mg total) by mouth 2 (two) times daily.   hydroxychloroquine 200 MG tablet Commonly known as: PLAQUENIL Take 200 mg by mouth 2 (two) times daily.   ipratropium-albuterol 0.5-2.5 (3) MG/3ML Soln Commonly known as: DUONEB Take 3 mLs by nebulization every 6 (six) hours as needed.   loratadine 10 MG tablet Commonly known as: CLARITIN Take 1 tablet (10 mg total) by mouth daily.   pantoprazole 40 MG tablet Commonly known as: PROTONIX Take 40 mg by mouth  daily.   potassium chloride 10 MEQ tablet Commonly known as: KLOR-CON Take 10 mEq by mouth daily.   predniSONE 10 MG tablet Commonly known as: DELTASONE Take 30 mg daily for 1 day, 20 mg daily for 1 days,10 mg daily for 1 day, then stop   tiotropium 18 MCG inhalation capsule Commonly known as: Spiriva HandiHaler Place 1 capsule (18 mcg total) into inhaler and inhale daily.   Vitamin D3 50 MCG (2000 UT) capsule 2 capsules   VITAMIN D3 COMPLETE PO Take by mouth. Taking 2000 U tablets - 2 tablets once a day   Xarelto 10 MG Tabs tablet Generic drug: rivaroxaban Take 10 mg by mouth daily.               Durable Medical Equipment  (From admission, onward)           Start     Ordered   06/08/23 0926  For home use only DME oxygen  Once       Question Answer Comment  Length of Need 6 Months   Mode or (Route) Nasal cannula   Liters per Minute 2   Frequency Continuous (stationary and portable oxygen unit needed)   Oxygen delivery system Gas      06/08/23 0926   06/08/23 0849  For home use only DME Nebulizer machine  Once       Question Answer Comment  Patient needs a nebulizer to treat with the following condition COPD (chronic obstructive pulmonary disease) (HCC)   Length of Need 12 Months      06/08/23 0848            Follow-up Information     [facility] up.   Specialty: Home Health Services Why: [facility] will contact you within 48 hours of discharge to arrange home health visits Contact information: [address] [place] [address] [phone]         Aliene Beams, MD. Schedule an appointment as soon as possible for a visit in 1 week(s).   Specialty: Family Medicine Contact information: [phone] [address] [place] [address] [phone]         [facility] Follow up.   Why: [facility] will provide oxygen Contact information: [address] [place] [address] [phone]                 Allergies  Allergen Reactions   Ciprofloxacin Anaphylaxis    unknown     Other Procedures/Studies: VAS [place] LOWER EXTREMITY VENOUS (DVT)  Result Date: 06/06/2023  Lower Venous DVT Study Patient Name:  KHANH FELICIA Ault  Date of Exam:   06/05/2023 Medical Rec #: [id number]  Accession #:    [id number] Date of Birth: 1946-02-20            Patient Gender: F Patient Age:   77 years Exam Location:  [facility] Procedure:      VAS [place] LOWER EXTREMITY VENOUS (DVT) Referring Phys: Madelyn Flavors --------------------------------------------------------------------------------  Indications: Pain, and SOB.  Comparison Study: No prior exam. Performing Technologist: Fernande Bras  Examination Guidelines: A complete evaluation includes B-mode imaging, spectral Doppler, color Doppler, and power Doppler as needed of all accessible portions of each vessel. Bilateral testing is considered an integral part of a complete examination. Limited examinations for reoccurring indications may be performed as noted. The reflux portion of the exam is performed with the patient in reverse Trendelenburg.  +---------+---------------+---------+-----------+----------+--------------+ RIGHT    CompressibilityPhasicitySpontaneityPropertiesThrombus Aging +---------+---------------+---------+-----------+----------+--------------+ CFV      Full           Yes      Yes                                 +---------+---------------+---------+-----------+----------+--------------+ SFJ      Full                                                        +---------+---------------+---------+-----------+----------+--------------+ FV Prox  Full                                                        +---------+---------------+---------+-----------+----------+--------------+ FV Mid   Full                                                         +---------+---------------+---------+-----------+----------+--------------+ FV DistalFull                                                        +---------+---------------+---------+-----------+----------+--------------+ PFV      Full                                                        +---------+---------------+---------+-----------+----------+--------------+ POP      Full           Yes      Yes                                 +---------+---------------+---------+-----------+----------+--------------+ PTV      Full                                                        +---------+---------------+---------+-----------+----------+--------------+  PERO     Full                                                        +---------+---------------+---------+-----------+----------+--------------+   +----+---------------+---------+-----------+----------+--------------+ LEFTCompressibilityPhasicitySpontaneityPropertiesThrombus Aging +----+---------------+---------+-----------+----------+--------------+ CFV Full           Yes      Yes                                 +----+---------------+---------+-----------+----------+--------------+     Summary: RIGHT: - There is no evidence of deep vein thrombosis in the lower extremity.  - No cystic structure found in the popliteal fossa.  LEFT: - No evidence of common femoral vein obstruction.   *See table(s) above for measurements and observations. Electronically signed by Carolynn Sayers on 06/06/2023 at 7:34:07 AM.    Final    CT Angio Chest PE W and/or Wo Contrast  Result Date: 06/04/2023 CLINICAL DATA:  Pulmonary embolism (PE) suspected, high prob. Tachypnea. Dyspnea with exertion. EXAM: CT ANGIOGRAPHY CHEST WITH CONTRAST TECHNIQUE: Multidetector CT imaging of the chest was performed using the standard protocol during bolus administration of intravenous contrast. Multiplanar CT image reconstructions and MIPs were obtained to  evaluate the vascular anatomy. RADIATION DOSE REDUCTION: This exam was performed according to the departmental dose-optimization program which includes automated exposure control, adjustment of the mA and/or kV according to patient size and/or use of iterative reconstruction technique. CONTRAST:  75mL OMNIPAQUE IOHEXOL 350 MG/ML SOLN COMPARISON:  CT scan chest from 07/18/2022. FINDINGS: Cardiovascular: No evidence of embolism to the proximal subsegmental pulmonary artery level. Normal cardiac size. No pericardial effusion. No aortic aneurysm. Mild thoracic aortic peripheral vascular calcifications noted. Mediastinum/Nodes: Visualized thyroid gland appears grossly unremarkable. No solid / cystic mediastinal masses. The esophagus is nondistended precluding optimal assessment. Redemonstration of several mildly enlarged mediastinal and bilateral hilar lymph nodes with largest in the subcarinal location measuring up to 1.5 x 2.0 cm. These are grossly similar to the prior study and favored benign/reactive. No axillary lymphadenopathy by size criteria. Lungs/Pleura: The central tracheo-bronchial tree is patent. There is mild, smooth, circumferential thickening of the segmental and subsegmental bronchial walls, throughout bilateral lungs, which is nonspecific. Findings are most commonly seen with bronchitis or reactive airway disease, such as asthma. Diffuse mild centrilobular emphysematous changes noted with upper lobe predominance. There are atelectatic changes in the bilateral lung lower lobes. No mass or consolidation. No pleural effusion or pneumothorax. No suspicious lung nodules. Upper Abdomen: There is a 5 x 8 mm nonobstructing calculus in the right kidney upper pole. There is a partially exophytic, partially imaged at least 1.9 x 2.4 cm cyst arising from the right kidney lower pole. Remaining visualized upper abdominal viscera within normal limits. Musculoskeletal: The visualized soft tissues of the chest wall are  grossly unremarkable. No suspicious osseous lesions. There are mild multilevel degenerative changes in the visualized spine. There is stable mild-to-moderate anterior wedging deformity of T12 vertebra. Review of the MIP images confirms the above findings. IMPRESSION: *No embolism to the proximal subsegmental pulmonary artery level. *Thickening of the bronchial walls, most commonly seen with bronchitis or reactive airway disease such as asthma. *Multiple other nonacute observations, as described above. Aortic Atherosclerosis (ICD10-I70.0). Electronically Signed   By: Timoteo Expose.D.  On: 06/04/2023 17:18   DG Chest Port 1 View  Result Date: 06/04/2023 CLINICAL DATA:  Shortness of breath EXAM: PORTABLE CHEST 1 VIEW COMPARISON:  July 18, 2022 FINDINGS: The cardiomediastinal silhouette is unchanged in contour.Emphysematous changes. No pleural effusion. No pneumothorax. Bibasilar diffuse reticular opacities with bronchial cuffing. IMPRESSION: Bibasilar diffuse reticular opacities with bronchial cuffing. Findings are favored to reflect bronchitis. Followup PA and lateral chest X-ray is recommended in 4-6 weeks to ensure resolution. Given underlying emphysema, recommend evaluation for candidacy for annual lung cancer screening CT. Electronically Signed   By: Meda Klinefelter M.D.   On: 06/04/2023 11:41     TODAY-DAY OF DISCHARGE:  Subjective:   Jessica Orr today has no headache,no chest abdominal pain,no new weakness tingling or numbness, feels much better wants to go home today.   Objective:   Blood pressure (!) 124/54, pulse 60, temperature 98.1 F (36.7 C), temperature source Axillary, resp. rate 19, height 5' 0.5" (1.537 m), weight 78.9 kg, SpO2 (!) 85%.  Intake/Output Summary (Last 24 hours) at 06/08/2023 0929 Last data filed at 06/08/2023 0600 Gross per 24 hour  Intake 240 ml  Output --  Net 240 ml   Filed Weights   06/04/23 1029  Weight: 78.9 kg    Exam: Awake Alert, Oriented  *3, No new F.N deficits, Normal affect [place].AT,PERRAL Supple Neck,No JVD, No cervical lymphadenopathy appriciated.  Symmetrical Chest wall movement, Good air movement bilaterally, CTAB RRR,No Gallops,Rubs or new Murmurs, No Parasternal Heave +ve B.Sounds, Abd Soft, Non tender, No organomegaly appriciated, No rebound -guarding or rigidity. No Cyanosis, Clubbing or edema, No new Rash or bruise   PERTINENT RADIOLOGIC STUDIES: No results found.   PERTINENT LAB RESULTS: CBC: No results for input(s): "WBC", "HGB", "HCT", "PLT" in the last 72 hours.  CMET CMP     Component Value Date/Time   NA 136 06/05/2023 0436   K 3.6 06/05/2023 0436   CL 106 06/05/2023 0436   CO2 20 (L) 06/05/2023 0436   GLUCOSE 119 (H) 06/05/2023 0436   BUN 20 06/05/2023 0436   CREATININE 0.75 06/05/2023 0436   CREATININE 0.74 08/21/2019 1107   CALCIUM 8.7 (L) 06/05/2023 0436   PROT 6.8 06/04/2023 1200   ALBUMIN 3.0 (L) 06/04/2023 1200   AST 38 06/04/2023 1200   AST 21 08/21/2019 1107   ALT 27 06/04/2023 1200   ALT 20 08/21/2019 1107   ALKPHOS 40 06/04/2023 1200   BILITOT 0.8 06/04/2023 1200   BILITOT <0.2 (L) 08/21/2019 1107   GFRNONAA >60 06/05/2023 0436   GFRNONAA >60 08/21/2019 1107    GFR Estimated Creatinine Clearance: 57.2 mL/min (by C-G formula based on SCr of 0.75 mg/dL). No results for input(s): "LIPASE", "AMYLASE" in the last 72 hours. No results for input(s): "CKTOTAL", "CKMB", "CKMBINDEX", "TROPONINI" in the last 72 hours. Invalid input(s): "POCBNP" No results for input(s): "DDIMER" in the last 72 hours. No results for input(s): "HGBA1C" in the last 72 hours. No results for input(s): "CHOL", "HDL", "LDLCALC", "TRIG", "CHOLHDL", "LDLDIRECT" in the last 72 hours. No results for input(s): "TSH", "T4TOTAL", "T3FREE", "THYROIDAB" in the last 72 hours.  Invalid input(s): "FREET3" No results for input(s): "VITAMINB12", "FOLATE", "FERRITIN", "TIBC", "IRON", "RETICCTPCT" in the last 72  hours. Coags: No results for input(s): "INR" in the last 72 hours.  Invalid input(s): "PT" Microbiology: Recent Results (from the past 240 hour(s))  Resp panel by RT-PCR (RSV, Flu A&B, Covid) Anterior Nasal Swab     Status: None   Collection Time: 06/04/23  10:39 AM   Specimen: Anterior Nasal Swab  Result Value Ref Range Status   SARS Coronavirus 2 by RT PCR NEGATIVE NEGATIVE Final   Influenza A by PCR NEGATIVE NEGATIVE Final   Influenza B by PCR NEGATIVE NEGATIVE Final    Comment: (NOTE) The Xpert Xpress SARS-CoV-2/FLU/RSV plus assay is intended as an aid in the diagnosis of influenza from Nasopharyngeal swab specimens and should not be used as a sole basis for treatment. Nasal washings and aspirates are unacceptable for Xpert Xpress SARS-CoV-2/FLU/RSV testing.  Fact Sheet for Patients: BloggerCourse.com  Fact Sheet for Healthcare Providers: SeriousBroker.it  This test is not yet approved or cleared by the [place] FDA and has been authorized for detection and/or diagnosis of SARS-CoV-2 by FDA under an Emergency Use Authorization (EUA). This EUA will remain in effect (meaning this test can be used) for the duration of the COVID-19 declaration under Section 564(b)(1) of the Act, 21 U.S.C. section 360bbb-3(b)(1), unless the authorization is terminated or revoked.     Resp Syncytial Virus by PCR NEGATIVE NEGATIVE Final    Comment: (NOTE) Fact Sheet for Patients: BloggerCourse.com  Fact Sheet for Healthcare Providers: SeriousBroker.it  This test is not yet approved or cleared by the [place] FDA and has been authorized for detection and/or diagnosis of SARS-CoV-2 by FDA under an Emergency Use Authorization (EUA). This EUA will remain in effect (meaning this test can be used) for the duration of the COVID-19 declaration under Section 564(b)(1) of the Act, 21  U.S.C. section 360bbb-3(b)(1), unless the authorization is terminated or revoked.  Performed at [facility], [address] [place], [place] [address]     FURTHER DISCHARGE INSTRUCTIONS:  Get Medicines reviewed and adjusted: Please take all your medications with you for your next visit with your Primary MD  Laboratory/radiological data: Please request your Primary MD to go over all hospital tests and procedure/radiological results at the follow up, please ask your Primary MD to get all Hospital records sent to his/her office.  In some cases, they will be blood work, cultures and biopsy results pending at the time of your discharge. Please request that your primary care M.D. goes through all the records of your hospital data and follows up on these results.  Also Note the following: If you experience worsening of your admission symptoms, develop shortness of breath, life threatening emergency, suicidal or homicidal thoughts you must seek medical attention immediately by calling 911 or calling your MD immediately  if symptoms less severe.  You must read complete instructions/literature along with all the possible adverse reactions/side effects for all the Medicines you take and that have been prescribed to you. Take any new Medicines after you have completely understood and accpet all the possible adverse reactions/side effects.   Do not drive when taking Pain medications or sleeping medications (Benzodaizepines)  Do not take more than prescribed Pain, Sleep and Anxiety Medications. It is not advisable to combine anxiety,sleep and pain medications without talking with your primary care practitioner  Special Instructions: If you have smoked or chewed Tobacco  in the last 2 yrs please stop smoking, stop any regular Alcohol  and or any Recreational drug use.  Wear Seat belts while driving.  Please note: You were cared for by a hospitalist during your hospital stay. Once you are  discharged, your primary care physician will handle any further medical issues. Please note that NO REFILLS for any discharge medications will be authorized once you are discharged, as it  is imperative that you return to your primary care physician (or establish a relationship with a primary care physician if you do not have one) for your post hospital discharge needs so that they can reassess your need for medications and monitor your lab values.  Total Time spent coordinating discharge including counseling, education and face to face time equals greater than 30 minutes.  SignedJeoffrey Massed 06/08/2023 9:29 AM

## 2023-06-07 NOTE — Progress Notes (Signed)
PROGRESS NOTE        PATIENT DETAILS Name: Jessica Orr Age: 77 y.o. Sex: female Date of Birth: 11/17/1945 Admit Date: 06/04/2023 Admitting Physician Clydie Braun, MD MWN:[id number], Fleet Contras, MD  Brief Summary: Patient is a 77 y.o.  female with history of recurrent DVT secondary to factor V Leiden mutation on Xarelto, COPD, HTN-presented with cough/shortness of breath for approximately 4-5 days prior to this hospitalization-she was found to have acute bronchitis causing COPD exacerbation and subsequently admitted to the hospitalist service.  Significant events: 11/24>> admit to [facility]  Significant studies: 11/24>> CTA chest: No PE, thickening of bronchial walls-seen in bronchitis 11/25>> bilateral lower extremity Doppler: No DVT  Significant microbiology data: 11/24>> COVID/influenza/RSV PCR: Negative  Procedures: None  Consults: None  Subjective: Feels better but still wheezing-not yet at baseline.  Objective: Vitals: Blood pressure 126/60, pulse (!) 59, temperature (!) 97.5 F (36.4 C), temperature source Oral, resp. rate 20, height 5' 0.5" (1.537 m), weight 78.9 kg, SpO2 91%.   Exam: Gen Exam:Alert awake-not in any distress HEENT:atraumatic, normocephalic Chest: Moving air-has rhonchi scattered all over. CVS:S1S2 regular Abdomen:soft non tender, non distended Extremities:no edema Neurology: Non focal Skin: no rash  Pertinent Labs/Radiology:    Latest Ref Rng & Units 06/05/2023    4:36 AM 06/04/2023   10:39 AM 08/21/2019   11:07 AM  CBC  WBC 4.0 - 10.5 K/uL 9.5  7.7  6.3   Hemoglobin 12.0 - 15.0 g/dL 66.4  40.3  47.4   Hematocrit 36.0 - 46.0 % 35.7  33.8  41.1   Platelets 150 - 400 K/uL 198  150  229     Lab Results  Component Value Date   NA 136 06/05/2023   K 3.6 06/05/2023   CL 106 06/05/2023   CO2 20 (L) 06/05/2023      Assessment/Plan: Acute hypoxic respiratory failure secondary to acute bronchitis with COPD  exacerbation Slowly improving-still not back to baseline Requiring 1-2 L of oxygen Slowly taper prednisone Continue scheduled bronchodilators Zithromax x 3 days total Although no overt signs of volume overload-1 dose of IV Lasix today Continue mobility/pulmonary toileting with I-S/flutter valve If clinical improvement continues-Home soon.    History of VTE-factor V Leiden mutation CTA chest negative for PE Lower extremity Dopplers negative Continue Xarelto  History of lupus Plaquenil  HTN BP stable-without the use of any antihypertensives  Debility/deconditioning Prior history of polio PT/OT eval  Exophytic mass arising from right kidney Seen incidentally on CTA chest Further workup/imaging can be deferred to the outpatient-when she is more stable. Aware of this finding-the recommendation to proceed with outpatient imaging.  Obesity: Estimated body mass index is 33.41 kg/m as calculated from the following:   Height as of this encounter: 5' 0.5" (1.537 m).   Weight as of this encounter: 78.9 kg.   Code status:   Code Status: Full Code   DVT Prophylaxis: Place TED hose Start: 06/07/23 0735 rivaroxaban (XARELTO) tablet 10 mg Start: 06/04/23 1700 rivaroxaban (XARELTO) tablet 10 mg    Family Communication: None at bedside   Disposition Plan: Status is: Inpatient Remains inpatient appropriate because: Severity of illness   Planned Discharge Destination:Home   Diet: Diet Order             Diet Heart Room service appropriate? Yes; Fluid consistency: Thin  Diet effective now  Antimicrobial agents: Anti-infectives (From admission, onward)    Start     Dose/Rate Route Frequency Ordered Stop   06/05/23 1000  azithromycin (ZITHROMAX) tablet 500 mg        500 mg Oral Daily 06/05/23 0646 06/06/23 0942   06/04/23 2200  hydroxychloroquine (PLAQUENIL) tablet 200 mg        200 mg Oral 2 times daily 06/04/23 1553     06/04/23 1500   cefTRIAXone (ROCEPHIN) 1 g in sodium chloride 0.9 % 100 mL IVPB  Status:  Discontinued        1 g 200 mL/hr over 30 Minutes Intravenous  Once 06/04/23 1448 06/04/23 1459   06/04/23 1500  azithromycin (ZITHROMAX) tablet 500 mg        500 mg Oral  Once 06/04/23 1448 06/04/23 1503   06/04/23 1500  cefTRIAXone (ROCEPHIN) 2 g in sodium chloride 0.9 % 100 mL IVPB        2 g 200 mL/hr over 30 Minutes Intravenous  Once 06/04/23 1459 06/04/23 1535        MEDICATIONS: Scheduled Meds:  acidophilus  1 capsule Oral BID   arformoterol  15 mcg Nebulization BID   budesonide (PULMICORT) nebulizer solution  0.25 mg Nebulization BID   feeding supplement  237 mL Oral BID BM   guaiFENesin  600 mg Oral BID   hydroxychloroquine  200 mg Oral BID   ipratropium-albuterol  3 mL Nebulization TID   loratadine  10 mg Oral Daily   predniSONE  30 mg Oral Q breakfast   revefenacin  175 mcg Nebulization Daily   rivaroxaban  10 mg Oral Q supper   sodium chloride flush  3 mL Intravenous Q12H   Continuous Infusions: PRN Meds:.acetaminophen **OR** acetaminophen, albuterol, benzonatate, labetalol   I have personally reviewed following labs and imaging studies  LABORATORY DATA: CBC: Recent Labs  Lab 06/04/23 1039 06/05/23 0436  WBC 7.7 9.5  HGB 10.8* 11.7*  HCT 33.8* 35.7*  MCV 92.3 90.6  PLT 150 198    Basic Metabolic Panel: Recent Labs  Lab 06/04/23 1200 06/05/23 0436  NA 134* 136  K 4.1 3.6  CL 101 106  CO2 18* 20*  GLUCOSE 76 119*  BUN 18 20  CREATININE 0.77 0.75  CALCIUM 8.8* 8.7*    GFR: Estimated Creatinine Clearance: 57.2 mL/min (by C-G formula based on SCr of 0.75 mg/dL).  Liver Function Tests: Recent Labs  Lab 06/04/23 1200  AST 38  ALT 27  ALKPHOS 40  BILITOT 0.8  PROT 6.8  ALBUMIN 3.0*   No results for input(s): "LIPASE", "AMYLASE" in the last 168 hours. No results for input(s): "AMMONIA" in the last 168 hours.  Coagulation Profile: No results for input(s): "INR",  "PROTIME" in the last 168 hours.  Cardiac Enzymes: No results for input(s): "CKTOTAL", "CKMB", "CKMBINDEX", "TROPONINI" in the last 168 hours.  BNP (last 3 results) No results for input(s): "PROBNP" in the last 8760 hours.  Lipid Profile: No results for input(s): "CHOL", "HDL", "LDLCALC", "TRIG", "CHOLHDL", "LDLDIRECT" in the last 72 hours.  Thyroid Function Tests: No results for input(s): "TSH", "T4TOTAL", "FREET4", "T3FREE", "THYROIDAB" in the last 72 hours.  Anemia Panel: No results for input(s): "VITAMINB12", "FOLATE", "FERRITIN", "TIBC", "IRON", "RETICCTPCT" in the last 72 hours.  Urine analysis: No results found for: "COLORURINE", "APPEARANCEUR", "LABSPEC", "PHURINE", "GLUCOSEU", "HGBUR", "BILIRUBINUR", "KETONESUR", "PROTEINUR", "UROBILINOGEN", "NITRITE", "LEUKOCYTESUR"  Sepsis Labs: Lactic Acid, Venous No results found for: "LATICACIDVEN"  MICROBIOLOGY: Recent Results (from the past 240 hour(s))  Resp panel by RT-PCR (RSV, Flu A&B, Covid) Anterior Nasal Swab     Status: None   Collection Time: 06/04/23 10:39 AM   Specimen: Anterior Nasal Swab  Result Value Ref Range Status   SARS Coronavirus 2 by RT PCR NEGATIVE NEGATIVE Final   Influenza A by PCR NEGATIVE NEGATIVE Final   Influenza B by PCR NEGATIVE NEGATIVE Final    Comment: (NOTE) The Xpert Xpress SARS-CoV-2/FLU/RSV plus assay is intended as an aid in the diagnosis of influenza from Nasopharyngeal swab specimens and should not be used as a sole basis for treatment. Nasal washings and aspirates are unacceptable for Xpert Xpress SARS-CoV-2/FLU/RSV testing.  Fact Sheet for Patients: BloggerCourse.com  Fact Sheet for Healthcare Providers: SeriousBroker.it  This test is not yet approved or cleared by the [place] FDA and has been authorized for detection and/or diagnosis of SARS-CoV-2 by FDA under an Emergency Use Authorization (EUA). This EUA will remain in  effect (meaning this test can be used) for the duration of the COVID-19 declaration under Section 564(b)(1) of the Act, 21 U.S.C. section 360bbb-3(b)(1), unless the authorization is terminated or revoked.     Resp Syncytial Virus by PCR NEGATIVE NEGATIVE Final    Comment: (NOTE) Fact Sheet for Patients: BloggerCourse.com  Fact Sheet for Healthcare Providers: SeriousBroker.it  This test is not yet approved or cleared by the [place] FDA and has been authorized for detection and/or diagnosis of SARS-CoV-2 by FDA under an Emergency Use Authorization (EUA). This EUA will remain in effect (meaning this test can be used) for the duration of the COVID-19 declaration under Section 564(b)(1) of the Act, 21 U.S.C. section 360bbb-3(b)(1), unless the authorization is terminated or revoked.  Performed at [facility], [address] [place], [place] [address]     RADIOLOGY STUDIES/RESULTS: No results found.   LOS: 3 days   Jeoffrey Massed, MD  [facility]    To contact the attending provider between 7A-7P or the covering provider during after hours 7P-7A, please log into the web site [email or web address] and access using universal [place] password for that web site. If you do not have the password, please call the hospital operator.  06/07/2023, 10:47 AM

## 2023-06-07 NOTE — Plan of Care (Signed)
Pt has rested quietly throughout the night with no distress noted. Alert and oriented. On O2 1LNC. SR on the monitor. Up to [facility] to void and have BM's with assist X1. No complaints voiced.     Problem: Clinical Measurements: Goal: Ability to maintain clinical measurements within normal limits will improve Outcome: Progressing Goal: Respiratory complications will improve Outcome: Progressing   Problem: Activity: Goal: Risk for activity intolerance will decrease Outcome: Progressing   Problem: Pain Management: Goal: General experience of comfort will improve Outcome: Progressing

## 2023-06-07 NOTE — Progress Notes (Signed)
Mobility Specialist Progress Note:   06/07/23 1600  Mobility  Activity Ambulated with assistance in hallway  Level of Assistance Contact guard assist, steadying assist  Assistive Device Four wheel walker  Distance Ambulated (ft) 300 ft  Activity Response Tolerated well  Mobility Referral Yes  $Mobility charge 1 Mobility  Mobility Specialist Start Time (ACUTE ONLY) 1400  Mobility Specialist Stop Time (ACUTE ONLY) 1430  Mobility Specialist Time Calculation (min) (ACUTE ONLY) 30 min   Pt agreeable to mobility session. Found soiled in liquid BM, pericare performed. Pt able to ambulate good hallway distance with rollator and only minG. VSS on 2LO2, pt back in bed with all needs met, alarm on.  Addison Lank Mobility Specialist Please contact via SecureChat or  Rehab office at [phone]

## 2023-06-07 NOTE — Evaluation (Signed)
Occupational Therapy Evaluation Patient Details Name: Jessica Orr MRN: [id number] DOB: 1946-05-04 Today's Date: 06/07/2023   History of Present Illness Pt is a 77 y.o. female admitted 11/24 for acute respiratory failure with hypoxia. PMH:  HTN, COPD, lupus on Plaquenil with factor V Leiden deficiency, DVT on anticoagulation of Xarelto, polio, and uterine cancer   Clinical Impression   Pt is making great progress towards their acute OT goals. She remains eager to participate. Maintained 2L O2 with SpO2 >90% throughout session. No physical assist needed, superivsion A for mobility, toileting and hygiene with rollator for UE support. Reviewed energy conservation techniques with good recall. OT to continue to follow acutely to facilitate progress towards established goals. Pt will continue to benefit from discharge home with support of family as needed.         If plan is discharge home, recommend the following: A little help with walking and/or transfers;A little help with bathing/dressing/bathroom;Assistance with cooking/housework;Direct supervision/assist for medications management;Direct supervision/assist for financial management;Assist for transportation;Help with stairs or ramp for entrance       Equipment Recommendations  None recommended by OT       Precautions / Restrictions Precautions Precautions: Other (comment) Precaution Comments: watch sats Restrictions Weight Bearing Restrictions: No      Mobility Bed Mobility Overal bed mobility: Needs Assistance Bed Mobility: Supine to Sit, Sit to Supine     Supine to sit: Supervision Sit to supine: Supervision        Transfers Overall transfer level: Needs assistance Equipment used: Rollator (4 wheels), None Transfers: Sit to/from Stand Sit to Stand: Supervision                  Balance Overall balance assessment: Needs assistance Sitting-balance support: No upper extremity supported, Feet  supported Sitting balance-Leahy Scale: Good     Standing balance support: Bilateral upper extremity supported, During functional activity Standing balance-Leahy Scale: Fair                             ADL either performed or assessed with clinical judgement   ADL Overall ADL's : Needs assistance/impaired     Grooming: Supervision/safety;Standing Grooming Details (indicate cue type and reason): at the sink                 Toilet Transfer: Supervision/safety;Rolling walker (2 wheels);BSC/3in1 Toilet Transfer Details (indicate cue type and reason): tolerated >household distance with rollatorer Toileting- Clothing Manipulation and Hygiene: Supervision/safety;Sit to/from stand Toileting - Clothing Manipulation Details (indicate cue type and reason): standing at [facility]     Functional mobility during ADLs: Supervision/safety;Rolling walker (2 wheels) General ADL Comments: maintained 2L throughout     Vision   Vision Assessment?: No apparent visual deficits     Perception Perception: Within Functional Limits       Praxis Praxis: WFL       Pertinent Vitals/Pain Pain Assessment Pain Assessment: No/denies pain     Extremity/Trunk Assessment Upper Extremity Assessment Upper Extremity Assessment: Generalized weakness   Lower Extremity Assessment Lower Extremity Assessment: Defer to PT evaluation          Cognition Arousal: Alert Behavior During Therapy: WFL for tasks assessed/performed Overall Cognitive Status: Within Functional Limits for tasks assessed           General Comments  2L throughout with VSS, toileted at the [facility]  OT Goals(Current goals can be found in the care plan section) Acute Rehab OT Goals Patient Stated Goal: to get better OT Goal Formulation: With patient Time For Goal Achievement: 06/19/23 Potential to Achieve Goals: Good ADL Goals Additional ADL Goal #1: Pt will complete all BADLs  with mod I minding O2 tubing for safety Additional ADL Goal #2: Pt will indep recall at least 3 energy conservation strategies to apply in the home settings  OT Frequency: Min 1X/week       AM-PAC OT "6 Clicks" Daily Activity     Outcome Measure Help from another person eating meals?: None Help from another person taking care of personal grooming?: A Little Help from another person toileting, which includes using toliet, bedpan, or urinal?: A Little Help from another person bathing (including washing, rinsing, drying)?: A Little Help from another person to put on and taking off regular upper body clothing?: A Little Help from another person to put on and taking off regular lower body clothing?: A Little 6 Click Score: 19   End of Session Equipment Utilized During Treatment: Rollator (4 wheels);Oxygen Nurse Communication: Mobility status  Activity Tolerance: Patient tolerated treatment well Patient left: in bed;with call bell/phone within reach;with bed alarm set  OT Visit Diagnosis: Unsteadiness on feet (R26.81);Other abnormalities of gait and mobility (R26.89);Muscle weakness (generalized) (M62.81)                Time:  -1524   Charges:  OT General Charges $OT Visit: 1 Visit OT Treatments $Self Care/Home Management : 8-22 mins  Derenda Mis, OTR/L Acute Rehabilitation Services Office [phone] Secure Chat Communication Preferred   Donia Pounds 06/07/2023, 3:21 PM

## 2023-06-08 DIAGNOSIS — Z86718 Personal history of other venous thrombosis and embolism: Secondary | ICD-10-CM | POA: Diagnosis not present

## 2023-06-08 DIAGNOSIS — J9601 Acute respiratory failure with hypoxia: Secondary | ICD-10-CM | POA: Diagnosis not present

## 2023-06-08 DIAGNOSIS — D682 Hereditary deficiency of other clotting factors: Secondary | ICD-10-CM | POA: Diagnosis not present

## 2023-06-08 DIAGNOSIS — J40 Bronchitis, not specified as acute or chronic: Secondary | ICD-10-CM | POA: Diagnosis not present

## 2023-06-08 MED ORDER — TIOTROPIUM BROMIDE MONOHYDRATE 18 MCG IN CAPS: 18.0000 ug | ORAL_CAPSULE | Freq: Every day | RESPIRATORY_TRACT | 2 refills | Status: AC

## 2023-06-08 MED ORDER — GUAIFENESIN ER 600 MG PO TB12: 600.0000 mg | ORAL_TABLET | Freq: Two times a day (BID) | ORAL | 0 refills | Status: AC

## 2023-06-08 MED ORDER — PREDNISONE 10 MG PO TABS: ORAL_TABLET | ORAL | 0 refills | Status: AC

## 2023-06-08 MED ORDER — BENZONATATE 200 MG PO CAPS: 200.0000 mg | ORAL_CAPSULE | Freq: Three times a day (TID) | ORAL | 0 refills | Status: AC | PRN

## 2023-06-08 MED ORDER — FLUTICASONE PROPIONATE 50 MCG/ACT NA SUSP: 2.0000 | Freq: Every day | NASAL | 0 refills | Status: AC

## 2023-06-08 MED ORDER — IPRATROPIUM-ALBUTEROL 0.5-2.5 (3) MG/3ML IN SOLN: 3.0000 mL | Freq: Four times a day (QID) | RESPIRATORY_TRACT | 1 refills | Status: AC | PRN

## 2023-06-08 MED ORDER — FLUTICASONE-SALMETEROL 250-50 MCG/ACT IN AEPB: 1.0000 | INHALATION_SPRAY | Freq: Two times a day (BID) | RESPIRATORY_TRACT | 1 refills | Status: AC

## 2023-06-08 MED ORDER — LORATADINE 10 MG PO TABS: 10.0000 mg | ORAL_TABLET | Freq: Every day | ORAL | 0 refills | Status: AC

## 2023-06-08 MED ORDER — FUROSEMIDE 10 MG/ML IJ SOLN
20.0000 mg | Freq: Once | INTRAMUSCULAR | Status: AC
  Administered 2023-06-08: 20 mg via INTRAVENOUS
  Filled 2023-06-08: qty 2

## 2023-06-08 NOTE — TOC Transition Note (Signed)
Transition of Care [facility]) - CM/SW Discharge Note   Patient Details  Name: Jessica Orr MRN: [id number] Date of Birth: [birth year]  [facility]) CM/SW Contact:  Gordy Clement, RN Phone Number: 06/08/2023, 9:08 AM   Clinical Narrative:     Patient will DC to Son's house today  Address is [address] [facility] has been arranged  through [employer]. Home O2 and portable to be provided by [employer]. Patient may leave when portable O2 delivered to bedside.            Patient Goals and CMS Choice      Discharge Placement                         Discharge Plan and Services Additional resources added to the After Visit Summary for                                       Social Determinants of Health (SDOH) Interventions SDOH Screenings   Food Insecurity: No Food Insecurity (06/04/2023)  Housing: Low Risk  (06/04/2023)  Transportation Needs: No Transportation Needs (06/04/2023)  Utilities: Not At Risk (06/04/2023)  Tobacco Use: Medium Risk (06/04/2023)     Readmission Risk Interventions     No data to display

## 2023-06-08 NOTE — TOC CM/SW Note (Signed)
SATURATION QUALIFICATIONS: (This note is used to comply with regulatory documentation for home oxygen)   Patient Saturations on Room Air at Rest = 90%   Patient Saturations on Room Air while Ambulating = 85%   Patient Saturations on 2 Liters of oxygen while Ambulating = 95%   Please briefly explain why patient needs home oxygen: Patient is unable to maintain oxygen saturation above 90 on room air, requiring oxygen when ambulating.  Performed by floor RN

## 2023-06-08 NOTE — Progress Notes (Signed)
Mobility Specialist Progress Note:    06/08/23 0848  Mobility  Activity Ambulated with assistance in room;Ambulated with assistance to bathroom;Ambulated with assistance in hallway  Level of Assistance Contact guard assist, steadying assist  Assistive Device Front wheel walker  Distance Ambulated (ft) 200 ft  Activity Response Tolerated well  Mobility Referral Yes  $Mobility charge 1 Mobility  Mobility Specialist Start Time (ACUTE ONLY) 0848  Mobility Specialist Stop Time (ACUTE ONLY) 0900  Mobility Specialist Time Calculation (min) (ACUTE ONLY) 12 min   Pt received in bed, requesting assistance to [facility]. Peri care performed, agreeable to ambulate in hallway. Tolerated well, audible SOB throughout. Returned pt to room, all needs met, call bell in reach.   Feliciana Rossetti Mobility Specialist Please contact via Special educational needs teacher or  Rehab office at [phone]

## 2023-06-08 NOTE — Progress Notes (Signed)
SATURATION QUALIFICATIONS: (This note is used to comply with regulatory documentation for home oxygen)  Patient Saturations on Room Air at Rest = 90%  Patient Saturations on Room Air while Ambulating = 85%  Patient Saturations on 2 Liters of oxygen while Ambulating = 95%  Please briefly explain why patient needs home oxygen: Patient is unable to maintain oxygen saturation above 90 on room air, requiring oxygen when ambulating.

## 2023-06-08 NOTE — Plan of Care (Addendum)
Pt has rested quietly throughout the night with no distress noted. Alert and oriented. On O2 1LNC. No respiratory distress noted. Unable to wean O2 down any as sat drops to low 80's on room air. SR on the monitor. Up to [facility] to void. No complaints voiced.     Problem: Education: Goal: Knowledge of General Education information will improve Description: Including pain rating scale, medication(s)/side effects and non-pharmacologic comfort measures Outcome: Progressing   Problem: Clinical Measurements: Goal: Respiratory complications will improve Outcome: Progressing   Problem: Pain Management: Goal: General experience of comfort will improve Outcome: Progressing   Problem: Safety: Goal: Ability to remain free from injury will improve Outcome: Progressing

## 2023-06-12 ENCOUNTER — Other Ambulatory Visit (HOSPITAL_COMMUNITY): Payer: Self-pay

## 2023-06-19 DIAGNOSIS — J9601 Acute respiratory failure with hypoxia: Secondary | ICD-10-CM | POA: Diagnosis not present

## 2023-06-19 DIAGNOSIS — J441 Chronic obstructive pulmonary disease with (acute) exacerbation: Secondary | ICD-10-CM | POA: Diagnosis not present

## 2023-06-19 DIAGNOSIS — N281 Cyst of kidney, acquired: Secondary | ICD-10-CM | POA: Diagnosis not present

## 2023-06-19 DIAGNOSIS — Z742 Need for assistance at home and no other household member able to render care: Secondary | ICD-10-CM | POA: Diagnosis not present

## 2023-06-23 DIAGNOSIS — Z87891 Personal history of nicotine dependence: Secondary | ICD-10-CM | POA: Diagnosis not present

## 2023-06-23 DIAGNOSIS — Z79899 Other long term (current) drug therapy: Secondary | ICD-10-CM | POA: Diagnosis not present

## 2023-06-23 DIAGNOSIS — D682 Hereditary deficiency of other clotting factors: Secondary | ICD-10-CM | POA: Diagnosis not present

## 2023-06-23 DIAGNOSIS — E785 Hyperlipidemia, unspecified: Secondary | ICD-10-CM | POA: Diagnosis not present

## 2023-06-23 DIAGNOSIS — J9601 Acute respiratory failure with hypoxia: Secondary | ICD-10-CM | POA: Diagnosis not present

## 2023-06-23 DIAGNOSIS — Z7901 Long term (current) use of anticoagulants: Secondary | ICD-10-CM | POA: Diagnosis not present

## 2023-06-23 DIAGNOSIS — M810 Age-related osteoporosis without current pathological fracture: Secondary | ICD-10-CM | POA: Diagnosis not present

## 2023-06-23 DIAGNOSIS — J440 Chronic obstructive pulmonary disease with acute lower respiratory infection: Secondary | ICD-10-CM | POA: Diagnosis not present

## 2023-06-23 DIAGNOSIS — J441 Chronic obstructive pulmonary disease with (acute) exacerbation: Secondary | ICD-10-CM | POA: Diagnosis not present

## 2023-06-23 DIAGNOSIS — Z86718 Personal history of other venous thrombosis and embolism: Secondary | ICD-10-CM | POA: Diagnosis not present

## 2023-06-23 DIAGNOSIS — Z7951 Long term (current) use of inhaled steroids: Secondary | ICD-10-CM | POA: Diagnosis not present

## 2023-06-23 DIAGNOSIS — Z86711 Personal history of pulmonary embolism: Secondary | ICD-10-CM | POA: Diagnosis not present

## 2023-06-23 DIAGNOSIS — I10 Essential (primary) hypertension: Secondary | ICD-10-CM | POA: Diagnosis not present

## 2023-06-23 DIAGNOSIS — Z9981 Dependence on supplemental oxygen: Secondary | ICD-10-CM | POA: Diagnosis not present

## 2023-06-23 DIAGNOSIS — N281 Cyst of kidney, acquired: Secondary | ICD-10-CM | POA: Diagnosis not present

## 2023-06-23 DIAGNOSIS — L84 Corns and callosities: Secondary | ICD-10-CM | POA: Diagnosis not present

## 2023-06-23 DIAGNOSIS — Z8542 Personal history of malignant neoplasm of other parts of uterus: Secondary | ICD-10-CM | POA: Diagnosis not present

## 2023-06-23 DIAGNOSIS — J209 Acute bronchitis, unspecified: Secondary | ICD-10-CM | POA: Diagnosis not present

## 2023-06-28 DIAGNOSIS — J9601 Acute respiratory failure with hypoxia: Secondary | ICD-10-CM | POA: Diagnosis not present

## 2023-06-28 DIAGNOSIS — J440 Chronic obstructive pulmonary disease with acute lower respiratory infection: Secondary | ICD-10-CM | POA: Diagnosis not present

## 2023-06-28 DIAGNOSIS — M25511 Pain in right shoulder: Secondary | ICD-10-CM | POA: Diagnosis not present

## 2023-06-28 DIAGNOSIS — J209 Acute bronchitis, unspecified: Secondary | ICD-10-CM | POA: Diagnosis not present

## 2023-06-28 DIAGNOSIS — I10 Essential (primary) hypertension: Secondary | ICD-10-CM | POA: Diagnosis not present

## 2023-06-28 DIAGNOSIS — J441 Chronic obstructive pulmonary disease with (acute) exacerbation: Secondary | ICD-10-CM | POA: Diagnosis not present

## 2023-06-28 DIAGNOSIS — D682 Hereditary deficiency of other clotting factors: Secondary | ICD-10-CM | POA: Diagnosis not present

## 2023-06-30 DIAGNOSIS — J209 Acute bronchitis, unspecified: Secondary | ICD-10-CM | POA: Diagnosis not present

## 2023-06-30 DIAGNOSIS — J440 Chronic obstructive pulmonary disease with acute lower respiratory infection: Secondary | ICD-10-CM | POA: Diagnosis not present

## 2023-06-30 DIAGNOSIS — D682 Hereditary deficiency of other clotting factors: Secondary | ICD-10-CM | POA: Diagnosis not present

## 2023-06-30 DIAGNOSIS — J9601 Acute respiratory failure with hypoxia: Secondary | ICD-10-CM | POA: Diagnosis not present

## 2023-06-30 DIAGNOSIS — J441 Chronic obstructive pulmonary disease with (acute) exacerbation: Secondary | ICD-10-CM | POA: Diagnosis not present

## 2023-06-30 DIAGNOSIS — I10 Essential (primary) hypertension: Secondary | ICD-10-CM | POA: Diagnosis not present

## 2023-07-03 DIAGNOSIS — J440 Chronic obstructive pulmonary disease with acute lower respiratory infection: Secondary | ICD-10-CM | POA: Diagnosis not present

## 2023-07-03 DIAGNOSIS — J441 Chronic obstructive pulmonary disease with (acute) exacerbation: Secondary | ICD-10-CM | POA: Diagnosis not present

## 2023-07-03 DIAGNOSIS — D682 Hereditary deficiency of other clotting factors: Secondary | ICD-10-CM | POA: Diagnosis not present

## 2023-07-03 DIAGNOSIS — J209 Acute bronchitis, unspecified: Secondary | ICD-10-CM | POA: Diagnosis not present

## 2023-07-03 DIAGNOSIS — J9601 Acute respiratory failure with hypoxia: Secondary | ICD-10-CM | POA: Diagnosis not present

## 2023-07-03 DIAGNOSIS — I10 Essential (primary) hypertension: Secondary | ICD-10-CM | POA: Diagnosis not present

## 2023-07-04 DIAGNOSIS — J441 Chronic obstructive pulmonary disease with (acute) exacerbation: Secondary | ICD-10-CM | POA: Diagnosis not present

## 2023-07-04 DIAGNOSIS — J440 Chronic obstructive pulmonary disease with acute lower respiratory infection: Secondary | ICD-10-CM | POA: Diagnosis not present

## 2023-07-04 DIAGNOSIS — J9601 Acute respiratory failure with hypoxia: Secondary | ICD-10-CM | POA: Diagnosis not present

## 2023-07-04 DIAGNOSIS — J209 Acute bronchitis, unspecified: Secondary | ICD-10-CM | POA: Diagnosis not present

## 2023-07-04 DIAGNOSIS — I10 Essential (primary) hypertension: Secondary | ICD-10-CM | POA: Diagnosis not present

## 2023-07-04 DIAGNOSIS — D682 Hereditary deficiency of other clotting factors: Secondary | ICD-10-CM | POA: Diagnosis not present

## 2023-07-06 DIAGNOSIS — J9601 Acute respiratory failure with hypoxia: Secondary | ICD-10-CM | POA: Diagnosis not present

## 2023-07-06 DIAGNOSIS — J209 Acute bronchitis, unspecified: Secondary | ICD-10-CM | POA: Diagnosis not present

## 2023-07-06 DIAGNOSIS — I10 Essential (primary) hypertension: Secondary | ICD-10-CM | POA: Diagnosis not present

## 2023-07-06 DIAGNOSIS — D682 Hereditary deficiency of other clotting factors: Secondary | ICD-10-CM | POA: Diagnosis not present

## 2023-07-06 DIAGNOSIS — J441 Chronic obstructive pulmonary disease with (acute) exacerbation: Secondary | ICD-10-CM | POA: Diagnosis not present

## 2023-07-06 DIAGNOSIS — J440 Chronic obstructive pulmonary disease with acute lower respiratory infection: Secondary | ICD-10-CM | POA: Diagnosis not present

## 2023-07-10 DIAGNOSIS — J440 Chronic obstructive pulmonary disease with acute lower respiratory infection: Secondary | ICD-10-CM | POA: Diagnosis not present

## 2023-07-10 DIAGNOSIS — J9601 Acute respiratory failure with hypoxia: Secondary | ICD-10-CM | POA: Diagnosis not present

## 2023-07-10 DIAGNOSIS — J441 Chronic obstructive pulmonary disease with (acute) exacerbation: Secondary | ICD-10-CM | POA: Diagnosis not present

## 2023-07-10 DIAGNOSIS — J209 Acute bronchitis, unspecified: Secondary | ICD-10-CM | POA: Diagnosis not present

## 2023-07-10 DIAGNOSIS — D682 Hereditary deficiency of other clotting factors: Secondary | ICD-10-CM | POA: Diagnosis not present

## 2023-07-10 DIAGNOSIS — I10 Essential (primary) hypertension: Secondary | ICD-10-CM | POA: Diagnosis not present

## 2023-07-14 DIAGNOSIS — J209 Acute bronchitis, unspecified: Secondary | ICD-10-CM | POA: Diagnosis not present

## 2023-07-14 DIAGNOSIS — D682 Hereditary deficiency of other clotting factors: Secondary | ICD-10-CM | POA: Diagnosis not present

## 2023-07-14 DIAGNOSIS — I10 Essential (primary) hypertension: Secondary | ICD-10-CM | POA: Diagnosis not present

## 2023-07-14 DIAGNOSIS — J9601 Acute respiratory failure with hypoxia: Secondary | ICD-10-CM | POA: Diagnosis not present

## 2023-07-14 DIAGNOSIS — J440 Chronic obstructive pulmonary disease with acute lower respiratory infection: Secondary | ICD-10-CM | POA: Diagnosis not present

## 2023-07-14 DIAGNOSIS — J441 Chronic obstructive pulmonary disease with (acute) exacerbation: Secondary | ICD-10-CM | POA: Diagnosis not present

## 2023-07-18 DIAGNOSIS — J209 Acute bronchitis, unspecified: Secondary | ICD-10-CM | POA: Diagnosis not present

## 2023-07-18 DIAGNOSIS — J441 Chronic obstructive pulmonary disease with (acute) exacerbation: Secondary | ICD-10-CM | POA: Diagnosis not present

## 2023-07-18 DIAGNOSIS — D682 Hereditary deficiency of other clotting factors: Secondary | ICD-10-CM | POA: Diagnosis not present

## 2023-07-18 DIAGNOSIS — J9601 Acute respiratory failure with hypoxia: Secondary | ICD-10-CM | POA: Diagnosis not present

## 2023-07-18 DIAGNOSIS — I10 Essential (primary) hypertension: Secondary | ICD-10-CM | POA: Diagnosis not present

## 2023-07-18 DIAGNOSIS — J440 Chronic obstructive pulmonary disease with acute lower respiratory infection: Secondary | ICD-10-CM | POA: Diagnosis not present

## 2023-07-19 DIAGNOSIS — J9601 Acute respiratory failure with hypoxia: Secondary | ICD-10-CM | POA: Diagnosis not present

## 2023-07-19 DIAGNOSIS — D682 Hereditary deficiency of other clotting factors: Secondary | ICD-10-CM | POA: Diagnosis not present

## 2023-07-19 DIAGNOSIS — I10 Essential (primary) hypertension: Secondary | ICD-10-CM | POA: Diagnosis not present

## 2023-07-19 DIAGNOSIS — J440 Chronic obstructive pulmonary disease with acute lower respiratory infection: Secondary | ICD-10-CM | POA: Diagnosis not present

## 2023-07-19 DIAGNOSIS — J441 Chronic obstructive pulmonary disease with (acute) exacerbation: Secondary | ICD-10-CM | POA: Diagnosis not present

## 2023-07-19 DIAGNOSIS — J209 Acute bronchitis, unspecified: Secondary | ICD-10-CM | POA: Diagnosis not present

## 2023-07-20 DIAGNOSIS — I10 Essential (primary) hypertension: Secondary | ICD-10-CM | POA: Diagnosis not present

## 2023-07-20 DIAGNOSIS — J209 Acute bronchitis, unspecified: Secondary | ICD-10-CM | POA: Diagnosis not present

## 2023-07-20 DIAGNOSIS — J9601 Acute respiratory failure with hypoxia: Secondary | ICD-10-CM | POA: Diagnosis not present

## 2023-07-20 DIAGNOSIS — J440 Chronic obstructive pulmonary disease with acute lower respiratory infection: Secondary | ICD-10-CM | POA: Diagnosis not present

## 2023-07-20 DIAGNOSIS — J441 Chronic obstructive pulmonary disease with (acute) exacerbation: Secondary | ICD-10-CM | POA: Diagnosis not present

## 2023-07-20 DIAGNOSIS — D682 Hereditary deficiency of other clotting factors: Secondary | ICD-10-CM | POA: Diagnosis not present

## 2023-07-23 DIAGNOSIS — Z7901 Long term (current) use of anticoagulants: Secondary | ICD-10-CM | POA: Diagnosis not present

## 2023-07-23 DIAGNOSIS — Z9981 Dependence on supplemental oxygen: Secondary | ICD-10-CM | POA: Diagnosis not present

## 2023-07-23 DIAGNOSIS — Z7951 Long term (current) use of inhaled steroids: Secondary | ICD-10-CM | POA: Diagnosis not present

## 2023-07-23 DIAGNOSIS — I10 Essential (primary) hypertension: Secondary | ICD-10-CM | POA: Diagnosis not present

## 2023-07-23 DIAGNOSIS — J441 Chronic obstructive pulmonary disease with (acute) exacerbation: Secondary | ICD-10-CM | POA: Diagnosis not present

## 2023-07-23 DIAGNOSIS — Z87891 Personal history of nicotine dependence: Secondary | ICD-10-CM | POA: Diagnosis not present

## 2023-07-23 DIAGNOSIS — N281 Cyst of kidney, acquired: Secondary | ICD-10-CM | POA: Diagnosis not present

## 2023-07-23 DIAGNOSIS — Z86711 Personal history of pulmonary embolism: Secondary | ICD-10-CM | POA: Diagnosis not present

## 2023-07-23 DIAGNOSIS — Z79899 Other long term (current) drug therapy: Secondary | ICD-10-CM | POA: Diagnosis not present

## 2023-07-23 DIAGNOSIS — J9601 Acute respiratory failure with hypoxia: Secondary | ICD-10-CM | POA: Diagnosis not present

## 2023-07-23 DIAGNOSIS — Z8542 Personal history of malignant neoplasm of other parts of uterus: Secondary | ICD-10-CM | POA: Diagnosis not present

## 2023-07-23 DIAGNOSIS — E785 Hyperlipidemia, unspecified: Secondary | ICD-10-CM | POA: Diagnosis not present

## 2023-07-23 DIAGNOSIS — L84 Corns and callosities: Secondary | ICD-10-CM | POA: Diagnosis not present

## 2023-07-23 DIAGNOSIS — M810 Age-related osteoporosis without current pathological fracture: Secondary | ICD-10-CM | POA: Diagnosis not present

## 2023-07-23 DIAGNOSIS — J440 Chronic obstructive pulmonary disease with acute lower respiratory infection: Secondary | ICD-10-CM | POA: Diagnosis not present

## 2023-07-23 DIAGNOSIS — D682 Hereditary deficiency of other clotting factors: Secondary | ICD-10-CM | POA: Diagnosis not present

## 2023-07-23 DIAGNOSIS — Z86718 Personal history of other venous thrombosis and embolism: Secondary | ICD-10-CM | POA: Diagnosis not present

## 2023-07-23 DIAGNOSIS — J209 Acute bronchitis, unspecified: Secondary | ICD-10-CM | POA: Diagnosis not present

## 2023-07-26 DIAGNOSIS — I10 Essential (primary) hypertension: Secondary | ICD-10-CM | POA: Diagnosis not present

## 2023-07-26 DIAGNOSIS — J9601 Acute respiratory failure with hypoxia: Secondary | ICD-10-CM | POA: Diagnosis not present

## 2023-07-26 DIAGNOSIS — J209 Acute bronchitis, unspecified: Secondary | ICD-10-CM | POA: Diagnosis not present

## 2023-07-26 DIAGNOSIS — J441 Chronic obstructive pulmonary disease with (acute) exacerbation: Secondary | ICD-10-CM | POA: Diagnosis not present

## 2023-07-26 DIAGNOSIS — D682 Hereditary deficiency of other clotting factors: Secondary | ICD-10-CM | POA: Diagnosis not present

## 2023-07-26 DIAGNOSIS — J440 Chronic obstructive pulmonary disease with acute lower respiratory infection: Secondary | ICD-10-CM | POA: Diagnosis not present

## 2023-07-31 DIAGNOSIS — I10 Essential (primary) hypertension: Secondary | ICD-10-CM | POA: Diagnosis not present

## 2023-07-31 DIAGNOSIS — J441 Chronic obstructive pulmonary disease with (acute) exacerbation: Secondary | ICD-10-CM | POA: Diagnosis not present

## 2023-07-31 DIAGNOSIS — J209 Acute bronchitis, unspecified: Secondary | ICD-10-CM | POA: Diagnosis not present

## 2023-07-31 DIAGNOSIS — J440 Chronic obstructive pulmonary disease with acute lower respiratory infection: Secondary | ICD-10-CM | POA: Diagnosis not present

## 2023-07-31 DIAGNOSIS — D682 Hereditary deficiency of other clotting factors: Secondary | ICD-10-CM | POA: Diagnosis not present

## 2023-07-31 DIAGNOSIS — J9601 Acute respiratory failure with hypoxia: Secondary | ICD-10-CM | POA: Diagnosis not present

## 2023-08-02 DIAGNOSIS — J440 Chronic obstructive pulmonary disease with acute lower respiratory infection: Secondary | ICD-10-CM | POA: Diagnosis not present

## 2023-08-02 DIAGNOSIS — I10 Essential (primary) hypertension: Secondary | ICD-10-CM | POA: Diagnosis not present

## 2023-08-02 DIAGNOSIS — J9601 Acute respiratory failure with hypoxia: Secondary | ICD-10-CM | POA: Diagnosis not present

## 2023-08-02 DIAGNOSIS — J441 Chronic obstructive pulmonary disease with (acute) exacerbation: Secondary | ICD-10-CM | POA: Diagnosis not present

## 2023-08-02 DIAGNOSIS — D682 Hereditary deficiency of other clotting factors: Secondary | ICD-10-CM | POA: Diagnosis not present

## 2023-08-02 DIAGNOSIS — J209 Acute bronchitis, unspecified: Secondary | ICD-10-CM | POA: Diagnosis not present

## 2023-08-04 DIAGNOSIS — J9601 Acute respiratory failure with hypoxia: Secondary | ICD-10-CM | POA: Diagnosis not present

## 2023-08-04 DIAGNOSIS — I10 Essential (primary) hypertension: Secondary | ICD-10-CM | POA: Diagnosis not present

## 2023-08-04 DIAGNOSIS — J441 Chronic obstructive pulmonary disease with (acute) exacerbation: Secondary | ICD-10-CM | POA: Diagnosis not present

## 2023-08-04 DIAGNOSIS — J440 Chronic obstructive pulmonary disease with acute lower respiratory infection: Secondary | ICD-10-CM | POA: Diagnosis not present

## 2023-08-04 DIAGNOSIS — J209 Acute bronchitis, unspecified: Secondary | ICD-10-CM | POA: Diagnosis not present

## 2023-08-04 DIAGNOSIS — D682 Hereditary deficiency of other clotting factors: Secondary | ICD-10-CM | POA: Diagnosis not present

## 2023-08-11 DIAGNOSIS — D682 Hereditary deficiency of other clotting factors: Secondary | ICD-10-CM | POA: Diagnosis not present

## 2023-08-11 DIAGNOSIS — J209 Acute bronchitis, unspecified: Secondary | ICD-10-CM | POA: Diagnosis not present

## 2023-08-11 DIAGNOSIS — J440 Chronic obstructive pulmonary disease with acute lower respiratory infection: Secondary | ICD-10-CM | POA: Diagnosis not present

## 2023-08-11 DIAGNOSIS — J9601 Acute respiratory failure with hypoxia: Secondary | ICD-10-CM | POA: Diagnosis not present

## 2023-08-11 DIAGNOSIS — J441 Chronic obstructive pulmonary disease with (acute) exacerbation: Secondary | ICD-10-CM | POA: Diagnosis not present

## 2023-08-11 DIAGNOSIS — I10 Essential (primary) hypertension: Secondary | ICD-10-CM | POA: Diagnosis not present

## 2023-08-31 ENCOUNTER — Institutional Professional Consult (permissible substitution): Payer: Medicare Other | Admitting: Pulmonary Disease

## 2023-08-31 NOTE — Progress Notes (Deleted)
 Synopsis: Referred in *** for ***  Subjective:   PATIENT ID: Jessica Orr GENDER: female DOB: 06/07/1946, MRN: [id number]   HPI  No chief complaint on file.   ***  Past Medical History:  Diagnosis Date   Cancer (HCC)    uterine   COPD (chronic obstructive pulmonary disease) (HCC)    DVT (deep venous thrombosis) (HCC)    Esophageal stricture    Factor V deficiency (HCC)    Hyperlipidemia    Hypertension    Polio 1950     Family History  Problem Relation Age of Onset   Hodgkin's lymphoma Mother    Heart attack Father    Alzheimer's disease Father    Hypertension Father    Cancer Maternal Grandmother    Emphysema Maternal Grandfather    Leukemia Paternal Grandmother      Social History   Socioeconomic History   Marital status: Widowed    Spouse name: Not on file   Number of children: 1   Years of education: Not on file   Highest education level: Not on file  Occupational History   Occupation: Retired Financial planner  Tobacco Use   Smoking status: Former    Current packs/day: 0.00    Average packs/day: 1 pack/day for 50.0 years (50.0 ttl pk-yrs)    Types: Cigarettes    Start date: 02/15/1966    Quit date: 02/16/2016    Years since quitting: 7.5   Smokeless tobacco: Never  Substance and Sexual Activity   Alcohol use: No   Drug use: No   Sexual activity: Not on file  Other Topics Concern   Not on file  Social History Narrative   Not on file   Social Drivers of Health   Financial Resource Strain: Not on file  Food Insecurity: No Food Insecurity (06/04/2023)   Hunger Vital Sign    Worried About Running Out of Food in the Last Year: Never true    Ran Out of Food in the Last Year: Never true  Transportation Needs: No Transportation Needs (06/04/2023)   PRAPARE - Administrator, Civil Service (Medical): No    Lack of Transportation (Non-Medical): No  Physical Activity: Not on file  Stress: Not on file  Social Connections: Not on file   Intimate Partner Violence: Not At Risk (06/04/2023)   Humiliation, Afraid, Rape, and Kick questionnaire    Fear of Current or Ex-Partner: No    Emotionally Abused: No    Physically Abused: No    Sexually Abused: No     Allergies  Allergen Reactions   Ciprofloxacin Anaphylaxis    unknown     Outpatient Medications Prior to Visit  Medication Sig Dispense Refill   acetaminophen (TYLENOL) 500 MG tablet Take 500 mg by mouth every 6 (six) hours as needed.     alendronate (FOSAMAX) 70 MG tablet Take 70 mg by mouth once a week.     atorvastatin (LIPITOR) 10 MG tablet Take 10 mg by mouth daily. Take one at bedtime     benzonatate (TESSALON) 200 MG capsule Take 1 capsule (200 mg total) by mouth 3 (three) times daily as needed for cough. 20 capsule 0   Cholecalciferol (VITAMIN D3) 50 MCG (2000 UT) capsule 2 capsules     fluticasone (FLONASE) 50 MCG/ACT nasal spray Place 2 sprays into both nostrils daily. 9.9 mL 0   fluticasone-salmeterol (ADVAIR DISKUS) 250-50 MCG/ACT AEPB Inhale 1 puff into the lungs in the morning and at bedtime. 60  each 1   guaiFENesin (MUCINEX) 600 MG 12 hr tablet Take 1 tablet (600 mg total) by mouth 2 (two) times daily. 15 tablet 0   hydroxychloroquine (PLAQUENIL) 200 MG tablet Take 200 mg by mouth 2 (two) times daily.     ipratropium-albuterol (DUONEB) 0.5-2.5 (3) MG/3ML SOLN Take 3 mLs by nebulization every 6 (six) hours as needed. 360 mL 1   loratadine (CLARITIN) 10 MG tablet Take 1 tablet (10 mg total) by mouth daily. 30 tablet 0   Multiple Vitamins-Minerals (VITAMIN D3 COMPLETE PO) Take by mouth. Taking 2000 U tablets - 2 tablets once a day     pantoprazole (PROTONIX) 40 MG tablet Take 40 mg by mouth daily.     potassium chloride (K-DUR) 10 MEQ tablet Take 10 mEq by mouth daily.     predniSONE (DELTASONE) 10 MG tablet Take 30 mg daily for 1 day, 20 mg daily for 1 days,10 mg daily for 1 day, then stop 6 tablet 0   tiotropium (SPIRIVA HANDIHALER) 18 MCG inhalation  capsule Place 1 capsule (18 mcg total) into inhaler and inhale daily. 30 capsule 2   XARELTO 10 MG TABS tablet Take 10 mg by mouth daily.     No facility-administered medications prior to visit.    ROS    Objective:  There were no vitals filed for this visit.   Physical Exam    CBC    Component Value Date/Time   WBC 9.5 06/05/2023 0436   RBC 3.94 06/05/2023 0436   HGB 11.7 (L) 06/05/2023 0436   HCT 35.7 (L) 06/05/2023 0436   PLT 198 06/05/2023 0436   MCV 90.6 06/05/2023 0436   MCH 29.7 06/05/2023 0436   MCHC 32.8 06/05/2023 0436   RDW 14.8 06/05/2023 0436   LYMPHSABS 1.3 08/21/2019 1107   MONOABS 0.7 08/21/2019 1107   EOSABS 0.2 08/21/2019 1107   BASOSABS 0.0 08/21/2019 1107     Chest imaging:  PFT:     No data to display          Labs:  Path:  Echo:  Heart Catheterization:       Assessment & Plan:   No diagnosis found.  Discussion: ***    Current Outpatient Medications:    acetaminophen (TYLENOL) 500 MG tablet, Take 500 mg by mouth every 6 (six) hours as needed., Disp: , Rfl:    alendronate (FOSAMAX) 70 MG tablet, Take 70 mg by mouth once a week., Disp: , Rfl:    atorvastatin (LIPITOR) 10 MG tablet, Take 10 mg by mouth daily. Take one at bedtime, Disp: , Rfl:    benzonatate (TESSALON) 200 MG capsule, Take 1 capsule (200 mg total) by mouth 3 (three) times daily as needed for cough., Disp: 20 capsule, Rfl: 0   Cholecalciferol (VITAMIN D3) 50 MCG (2000 UT) capsule, 2 capsules, Disp: , Rfl:    fluticasone (FLONASE) 50 MCG/ACT nasal spray, Place 2 sprays into both nostrils daily., Disp: 9.9 mL, Rfl: 0   fluticasone-salmeterol (ADVAIR DISKUS) 250-50 MCG/ACT AEPB, Inhale 1 puff into the lungs in the morning and at bedtime., Disp: 60 each, Rfl: 1   guaiFENesin (MUCINEX) 600 MG 12 hr tablet, Take 1 tablet (600 mg total) by mouth 2 (two) times daily., Disp: 15 tablet, Rfl: 0   hydroxychloroquine (PLAQUENIL) 200 MG tablet, Take 200 mg by mouth 2 (two)  times daily., Disp: , Rfl:    ipratropium-albuterol (DUONEB) 0.5-2.5 (3) MG/3ML SOLN, Take 3 mLs by nebulization every 6 (six) hours as  needed., Disp: 360 mL, Rfl: 1   loratadine (CLARITIN) 10 MG tablet, Take 1 tablet (10 mg total) by mouth daily., Disp: 30 tablet, Rfl: 0   Multiple Vitamins-Minerals (VITAMIN D3 COMPLETE PO), Take by mouth. Taking 2000 U tablets - 2 tablets once a day, Disp: , Rfl:    pantoprazole (PROTONIX) 40 MG tablet, Take 40 mg by mouth daily., Disp: , Rfl:    potassium chloride (K-DUR) 10 MEQ tablet, Take 10 mEq by mouth daily., Disp: , Rfl:    predniSONE (DELTASONE) 10 MG tablet, Take 30 mg daily for 1 day, 20 mg daily for 1 days,10 mg daily for 1 day, then stop, Disp: 6 tablet, Rfl: 0   tiotropium (SPIRIVA HANDIHALER) 18 MCG inhalation capsule, Place 1 capsule (18 mcg total) into inhaler and inhale daily., Disp: 30 capsule, Rfl: 2   XARELTO 10 MG TABS tablet, Take 10 mg by mouth daily., Disp: , Rfl:

## 2023-09-04 ENCOUNTER — Encounter: Payer: Self-pay | Admitting: Pulmonary Disease

## 2023-10-02 DIAGNOSIS — M549 Dorsalgia, unspecified: Secondary | ICD-10-CM | POA: Diagnosis not present

## 2023-10-02 DIAGNOSIS — M359 Systemic involvement of connective tissue, unspecified: Secondary | ICD-10-CM | POA: Diagnosis not present

## 2023-10-02 DIAGNOSIS — Z8739 Personal history of other diseases of the musculoskeletal system and connective tissue: Secondary | ICD-10-CM | POA: Diagnosis not present

## 2023-10-02 DIAGNOSIS — Z79899 Other long term (current) drug therapy: Secondary | ICD-10-CM | POA: Diagnosis not present

## 2023-10-02 DIAGNOSIS — M810 Age-related osteoporosis without current pathological fracture: Secondary | ICD-10-CM | POA: Diagnosis not present

## 2023-10-02 DIAGNOSIS — M79643 Pain in unspecified hand: Secondary | ICD-10-CM | POA: Diagnosis not present

## 2023-10-02 DIAGNOSIS — M1990 Unspecified osteoarthritis, unspecified site: Secondary | ICD-10-CM | POA: Diagnosis not present

## 2023-10-02 DIAGNOSIS — I878 Other specified disorders of veins: Secondary | ICD-10-CM | POA: Diagnosis not present

## 2023-10-02 DIAGNOSIS — M7989 Other specified soft tissue disorders: Secondary | ICD-10-CM | POA: Diagnosis not present

## 2023-10-05 DIAGNOSIS — Z1211 Encounter for screening for malignant neoplasm of colon: Secondary | ICD-10-CM | POA: Diagnosis not present

## 2023-10-06 DIAGNOSIS — K219 Gastro-esophageal reflux disease without esophagitis: Secondary | ICD-10-CM | POA: Diagnosis not present

## 2023-10-06 DIAGNOSIS — Z79899 Other long term (current) drug therapy: Secondary | ICD-10-CM | POA: Diagnosis not present

## 2023-10-06 DIAGNOSIS — D6851 Activated protein C resistance: Secondary | ICD-10-CM | POA: Diagnosis not present

## 2023-10-06 DIAGNOSIS — R7303 Prediabetes: Secondary | ICD-10-CM | POA: Diagnosis not present

## 2023-10-06 DIAGNOSIS — E785 Hyperlipidemia, unspecified: Secondary | ICD-10-CM | POA: Diagnosis not present

## 2023-10-06 DIAGNOSIS — J449 Chronic obstructive pulmonary disease, unspecified: Secondary | ICD-10-CM | POA: Diagnosis not present

## 2023-10-06 DIAGNOSIS — M1990 Unspecified osteoarthritis, unspecified site: Secondary | ICD-10-CM | POA: Diagnosis not present

## 2023-10-06 DIAGNOSIS — I10 Essential (primary) hypertension: Secondary | ICD-10-CM | POA: Diagnosis not present

## 2023-10-06 DIAGNOSIS — Z7901 Long term (current) use of anticoagulants: Secondary | ICD-10-CM | POA: Diagnosis not present

## 2023-10-06 DIAGNOSIS — Z0000 Encounter for general adult medical examination without abnormal findings: Secondary | ICD-10-CM | POA: Diagnosis not present

## 2023-10-06 DIAGNOSIS — J302 Other seasonal allergic rhinitis: Secondary | ICD-10-CM | POA: Diagnosis not present

## 2023-10-06 DIAGNOSIS — M810 Age-related osteoporosis without current pathological fracture: Secondary | ICD-10-CM | POA: Diagnosis not present

## 2023-12-11 DIAGNOSIS — H3581 Retinal edema: Secondary | ICD-10-CM | POA: Diagnosis not present

## 2023-12-11 DIAGNOSIS — M3210 Systemic lupus erythematosus, organ or system involvement unspecified: Secondary | ICD-10-CM | POA: Diagnosis not present

## 2023-12-11 DIAGNOSIS — Z79899 Other long term (current) drug therapy: Secondary | ICD-10-CM | POA: Diagnosis not present

## 2023-12-11 DIAGNOSIS — H40013 Open angle with borderline findings, low risk, bilateral: Secondary | ICD-10-CM | POA: Diagnosis not present

## 2023-12-15 DIAGNOSIS — Z79899 Other long term (current) drug therapy: Secondary | ICD-10-CM | POA: Diagnosis not present

## 2023-12-18 ENCOUNTER — Ambulatory Visit (INDEPENDENT_AMBULATORY_CARE_PROVIDER_SITE_OTHER): Admitting: Podiatry

## 2023-12-18 DIAGNOSIS — M7752 Other enthesopathy of left foot: Secondary | ICD-10-CM

## 2023-12-18 DIAGNOSIS — M79671 Pain in right foot: Secondary | ICD-10-CM

## 2023-12-18 DIAGNOSIS — B351 Tinea unguium: Secondary | ICD-10-CM

## 2023-12-18 DIAGNOSIS — M79672 Pain in left foot: Secondary | ICD-10-CM | POA: Diagnosis not present

## 2023-12-18 NOTE — Progress Notes (Signed)
 Patient presents for evaluation and treatment of tenderness and some redness around nails feet.  Tenderness around toes with walking and wearing shoes.  Also complains of pain distal aspect fourth toe.  States she sometimes gets a callus in this area.  Physical exam:  General appearance: Alert, pleasant, and in no acute distress.  Vascular: Pedal pulses: DP 2/4 B/L, PT 0/4 B/L.  Moderate edema lower legs bilaterally  Neurological:  Light touch intact.  Normal Achilles tendon reflex bilaterally.  No clonus or spasticity noted.  Dermatologic:  Nails thickened, disfigured, discolored 1-5 BL with subungual debris.  Redness and hypertrophic nail folds along nail folds bilaterally but no signs of drainage or infection.  Musculoskeletal:  Hammertoes 2 through 5 bilaterally.  Mild hallux abductovalgus 40 bilaterally.  Tenderness on the distal aspect of the fourth toe.   Diagnosis: 1. Painful onychomycotic nails 1 through 5 bilaterally. 2. Pain toes 1 through 5 bilaterally. 3.  Bursitis distal aspect fourth toe Plan: -New patient office visit level 3 for evaluation and management. - Discussed review the onychomycotic discussed the fourth toe and the hammertoe resulting in increased pressure causing inflamed bursa.  Recommended Voltaren gel as needed.  Wear JAP socks with good cushioning and support and cushioning. -Debrided onychomycotic nails 1 through 5 bilaterally.  Return 3 months

## 2024-03-18 ENCOUNTER — Encounter: Payer: Self-pay | Admitting: Podiatry

## 2024-03-18 ENCOUNTER — Ambulatory Visit (INDEPENDENT_AMBULATORY_CARE_PROVIDER_SITE_OTHER): Admitting: Podiatry

## 2024-03-18 DIAGNOSIS — B351 Tinea unguium: Secondary | ICD-10-CM

## 2024-03-18 DIAGNOSIS — M79672 Pain in left foot: Secondary | ICD-10-CM

## 2024-03-18 DIAGNOSIS — M79671 Pain in right foot: Secondary | ICD-10-CM | POA: Diagnosis not present

## 2024-03-18 NOTE — Progress Notes (Signed)
 Patient presents for evaluation and treatment of tenderness and some redness around nails feet.  Tenderness around toes with walking and wearing shoes.  Physical exam:  General appearance: Alert, pleasant, and in no acute distress.  Vascular: Pedal pulses: DP 2/4 B/L, PT 0/4 B/L. Moderate edema lower legs bilaterally  Neu  Dermatologic:  Nails thickened, disfigured, discolored 1-5 BL with subungual debris.  Redness and hypertrophic nail folds along nail folds bilaterally but no signs of drainage or infection.  Musculoskeletal:     Diagnosis: 1. Painful onychomycotic nails 1 through 5 bilaterally. 2. Pain toes 1 through 5 bilaterally.  Plan: -Debrided onychomycotic nails 1 through 5 bilaterally.  Sharply debrided nails with nail clipper and reduced with a power bur.  Return 3 months RFC

## 2024-04-01 DIAGNOSIS — H3581 Retinal edema: Secondary | ICD-10-CM | POA: Diagnosis not present

## 2024-04-01 DIAGNOSIS — E113293 Type 2 diabetes mellitus with mild nonproliferative diabetic retinopathy without macular edema, bilateral: Secondary | ICD-10-CM | POA: Diagnosis not present

## 2024-04-01 DIAGNOSIS — Z79899 Other long term (current) drug therapy: Secondary | ICD-10-CM | POA: Diagnosis not present

## 2024-04-01 DIAGNOSIS — M3210 Systemic lupus erythematosus, organ or system involvement unspecified: Secondary | ICD-10-CM | POA: Diagnosis not present

## 2024-04-03 DIAGNOSIS — Z79899 Other long term (current) drug therapy: Secondary | ICD-10-CM | POA: Diagnosis not present

## 2024-04-03 DIAGNOSIS — I878 Other specified disorders of veins: Secondary | ICD-10-CM | POA: Diagnosis not present

## 2024-04-03 DIAGNOSIS — M359 Systemic involvement of connective tissue, unspecified: Secondary | ICD-10-CM | POA: Diagnosis not present

## 2024-04-03 DIAGNOSIS — Z8739 Personal history of other diseases of the musculoskeletal system and connective tissue: Secondary | ICD-10-CM | POA: Diagnosis not present

## 2024-04-03 DIAGNOSIS — M79643 Pain in unspecified hand: Secondary | ICD-10-CM | POA: Diagnosis not present

## 2024-04-03 DIAGNOSIS — M549 Dorsalgia, unspecified: Secondary | ICD-10-CM | POA: Diagnosis not present

## 2024-04-03 DIAGNOSIS — M1990 Unspecified osteoarthritis, unspecified site: Secondary | ICD-10-CM | POA: Diagnosis not present

## 2024-04-03 DIAGNOSIS — Z23 Encounter for immunization: Secondary | ICD-10-CM | POA: Diagnosis not present

## 2024-04-03 DIAGNOSIS — M810 Age-related osteoporosis without current pathological fracture: Secondary | ICD-10-CM | POA: Diagnosis not present

## 2024-04-03 DIAGNOSIS — M7989 Other specified soft tissue disorders: Secondary | ICD-10-CM | POA: Diagnosis not present

## 2024-04-05 DIAGNOSIS — Z79899 Other long term (current) drug therapy: Secondary | ICD-10-CM | POA: Diagnosis not present

## 2024-04-05 DIAGNOSIS — M810 Age-related osteoporosis without current pathological fracture: Secondary | ICD-10-CM | POA: Diagnosis not present

## 2024-04-05 DIAGNOSIS — I10 Essential (primary) hypertension: Secondary | ICD-10-CM | POA: Diagnosis not present

## 2024-04-05 DIAGNOSIS — E785 Hyperlipidemia, unspecified: Secondary | ICD-10-CM | POA: Diagnosis not present

## 2024-04-05 DIAGNOSIS — R7303 Prediabetes: Secondary | ICD-10-CM | POA: Diagnosis not present

## 2024-04-05 DIAGNOSIS — D6851 Activated protein C resistance: Secondary | ICD-10-CM | POA: Diagnosis not present

## 2024-04-05 DIAGNOSIS — M329 Systemic lupus erythematosus, unspecified: Secondary | ICD-10-CM | POA: Diagnosis not present

## 2024-05-21 DIAGNOSIS — J40 Bronchitis, not specified as acute or chronic: Secondary | ICD-10-CM | POA: Diagnosis not present

## 2024-06-18 ENCOUNTER — Ambulatory Visit: Admitting: Podiatry

## 2024-07-02 ENCOUNTER — Ambulatory Visit: Admitting: Podiatry

## 2024-07-02 DIAGNOSIS — M79671 Pain in right foot: Secondary | ICD-10-CM

## 2024-07-02 DIAGNOSIS — B351 Tinea unguium: Secondary | ICD-10-CM

## 2024-07-02 DIAGNOSIS — M79672 Pain in left foot: Secondary | ICD-10-CM

## 2024-07-02 NOTE — Progress Notes (Signed)
 Patient presents for evaluation and treatment of tenderness and some redness around nails feet.  Tenderness around toes with walking and wearing shoes.  Physical exam:  General appearance: Alert, pleasant, and in no acute distress.  Vascular: Pedal pulses: DP 2/4 B/L, PT 0/4 B/L. Moderate edema lower legs bilaterally  Neu  Dermatologic:  Nails thickened, disfigured, discolored 1-5 BL with subungual debris.  Redness and hypertrophic nail folds along nail folds bilaterally but no signs of drainage or infection.  Musculoskeletal:     Diagnosis: 1. Painful onychomycotic nails 1 through 5 bilaterally. 2. Pain toes 1 through 5 bilaterally.  Plan: -Debrided onychomycotic nails 1 through 5 bilaterally.  Sharply debrided nails with nail clipper and reduced with a power bur.  Return 3 months RFC

## 2024-10-01 ENCOUNTER — Ambulatory Visit: Admitting: Podiatry
